# Patient Record
Sex: Female | Born: 1980 | Race: Black or African American | Hispanic: No | Marital: Single | Smoking: Former smoker
Health system: Southern US, Community
[De-identification: ages and names within clinical notes are randomized; demographics above are authoritative.]

## PROBLEM LIST (undated history)

## (undated) DIAGNOSIS — B20 Human immunodeficiency virus [HIV] disease: Secondary | ICD-10-CM

## (undated) DIAGNOSIS — Z21 Asymptomatic human immunodeficiency virus [HIV] infection status: Secondary | ICD-10-CM

## (undated) HISTORY — DX: Human immunodeficiency virus (HIV) disease: B20

## (undated) HISTORY — PX: TUBAL LIGATION: SHX77

## (undated) HISTORY — DX: Asymptomatic human immunodeficiency virus (hiv) infection status: Z21

---
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
## 1999-06-03 ENCOUNTER — Inpatient Hospital Stay (HOSPITAL_COMMUNITY): Admission: AD | Admit: 1999-06-03 | Discharge: 1999-06-06 | Payer: Self-pay | Admitting: Obstetrics & Gynecology

## 1999-07-06 ENCOUNTER — Other Ambulatory Visit: Admission: RE | Admit: 1999-07-06 | Discharge: 1999-07-06 | Payer: Self-pay | Admitting: Obstetrics & Gynecology

## 2000-03-04 ENCOUNTER — Encounter: Admission: RE | Admit: 2000-03-04 | Discharge: 2000-03-04 | Payer: Self-pay | Admitting: Family Medicine

## 2000-04-01 ENCOUNTER — Encounter: Admission: RE | Admit: 2000-04-01 | Discharge: 2000-04-01 | Payer: Self-pay | Admitting: Family Medicine

## 2000-04-08 ENCOUNTER — Encounter: Admission: RE | Admit: 2000-04-08 | Discharge: 2000-04-08 | Payer: Self-pay | Admitting: Family Medicine

## 2000-07-29 ENCOUNTER — Encounter: Admission: RE | Admit: 2000-07-29 | Discharge: 2000-07-29 | Payer: Self-pay | Admitting: Family Medicine

## 2000-09-22 ENCOUNTER — Encounter: Admission: RE | Admit: 2000-09-22 | Discharge: 2000-09-22 | Payer: Self-pay | Admitting: Family Medicine

## 2000-09-29 ENCOUNTER — Encounter: Admission: RE | Admit: 2000-09-29 | Discharge: 2000-09-29 | Payer: Self-pay | Admitting: Family Medicine

## 2000-10-06 ENCOUNTER — Encounter: Admission: RE | Admit: 2000-10-06 | Discharge: 2000-10-06 | Payer: Self-pay | Admitting: Family Medicine

## 2000-10-13 ENCOUNTER — Encounter: Admission: RE | Admit: 2000-10-13 | Discharge: 2000-10-13 | Payer: Self-pay | Admitting: Family Medicine

## 2000-10-14 ENCOUNTER — Emergency Department (HOSPITAL_COMMUNITY): Admission: EM | Admit: 2000-10-14 | Discharge: 2000-10-14 | Payer: Self-pay | Admitting: Emergency Medicine

## 2000-10-14 ENCOUNTER — Encounter: Payer: Self-pay | Admitting: Emergency Medicine

## 2000-11-16 ENCOUNTER — Encounter: Admission: RE | Admit: 2000-11-16 | Discharge: 2000-11-16 | Payer: Self-pay | Admitting: Family Medicine

## 2000-12-15 ENCOUNTER — Encounter: Admission: RE | Admit: 2000-12-15 | Discharge: 2000-12-15 | Payer: Self-pay | Admitting: Family Medicine

## 2001-01-02 ENCOUNTER — Emergency Department (HOSPITAL_COMMUNITY): Admission: EM | Admit: 2001-01-02 | Discharge: 2001-01-03 | Payer: Self-pay | Admitting: Emergency Medicine

## 2001-01-12 ENCOUNTER — Encounter: Admission: RE | Admit: 2001-01-12 | Discharge: 2001-01-12 | Payer: Self-pay | Admitting: Family Medicine

## 2001-01-19 ENCOUNTER — Other Ambulatory Visit: Admission: RE | Admit: 2001-01-19 | Discharge: 2001-01-19 | Payer: Self-pay | Admitting: Family Medicine

## 2001-01-19 ENCOUNTER — Encounter: Admission: RE | Admit: 2001-01-19 | Discharge: 2001-01-19 | Payer: Self-pay | Admitting: Family Medicine

## 2001-04-18 ENCOUNTER — Emergency Department (HOSPITAL_COMMUNITY): Admission: EM | Admit: 2001-04-18 | Discharge: 2001-04-18 | Payer: Self-pay | Admitting: Emergency Medicine

## 2001-06-02 ENCOUNTER — Inpatient Hospital Stay (HOSPITAL_COMMUNITY): Admission: AD | Admit: 2001-06-02 | Discharge: 2001-06-02 | Payer: Self-pay | Admitting: Obstetrics

## 2001-07-11 ENCOUNTER — Encounter: Admission: RE | Admit: 2001-07-11 | Discharge: 2001-07-11 | Payer: Self-pay | Admitting: Family Medicine

## 2001-09-28 ENCOUNTER — Encounter: Admission: RE | Admit: 2001-09-28 | Discharge: 2001-09-28 | Payer: Self-pay | Admitting: Family Medicine

## 2001-10-09 ENCOUNTER — Emergency Department (HOSPITAL_COMMUNITY): Admission: EM | Admit: 2001-10-09 | Discharge: 2001-10-09 | Payer: Self-pay | Admitting: Emergency Medicine

## 2001-10-11 ENCOUNTER — Encounter: Admission: RE | Admit: 2001-10-11 | Discharge: 2001-10-11 | Payer: Self-pay | Admitting: Family Medicine

## 2001-11-07 ENCOUNTER — Emergency Department (HOSPITAL_COMMUNITY): Admission: EM | Admit: 2001-11-07 | Discharge: 2001-11-07 | Payer: Self-pay | Admitting: Emergency Medicine

## 2001-11-09 ENCOUNTER — Emergency Department (HOSPITAL_COMMUNITY): Admission: EM | Admit: 2001-11-09 | Discharge: 2001-11-09 | Payer: Self-pay | Admitting: Emergency Medicine

## 2001-11-20 ENCOUNTER — Encounter: Admission: RE | Admit: 2001-11-20 | Discharge: 2001-11-20 | Payer: Self-pay | Admitting: Family Medicine

## 2001-11-23 ENCOUNTER — Encounter: Admission: RE | Admit: 2001-11-23 | Discharge: 2001-11-23 | Payer: Self-pay | Admitting: Sports Medicine

## 2001-11-27 ENCOUNTER — Encounter: Admission: RE | Admit: 2001-11-27 | Discharge: 2001-11-27 | Payer: Self-pay | Admitting: Family Medicine

## 2001-12-06 ENCOUNTER — Encounter: Admission: RE | Admit: 2001-12-06 | Discharge: 2001-12-06 | Payer: Self-pay | Admitting: Sports Medicine

## 2002-01-04 ENCOUNTER — Encounter: Admission: RE | Admit: 2002-01-04 | Discharge: 2002-01-04 | Payer: Self-pay | Admitting: Family Medicine

## 2002-01-18 ENCOUNTER — Encounter: Admission: RE | Admit: 2002-01-18 | Discharge: 2002-01-18 | Payer: Self-pay | Admitting: Family Medicine

## 2002-01-25 ENCOUNTER — Encounter: Admission: RE | Admit: 2002-01-25 | Discharge: 2002-01-25 | Payer: Self-pay | Admitting: Family Medicine

## 2002-02-05 ENCOUNTER — Encounter: Admission: RE | Admit: 2002-02-05 | Discharge: 2002-02-05 | Payer: Self-pay | Admitting: Family Medicine

## 2002-02-14 ENCOUNTER — Encounter: Admission: RE | Admit: 2002-02-14 | Discharge: 2002-02-14 | Payer: Self-pay | Admitting: Family Medicine

## 2002-02-22 ENCOUNTER — Other Ambulatory Visit: Admission: RE | Admit: 2002-02-22 | Discharge: 2002-02-22 | Payer: Self-pay | Admitting: Family Medicine

## 2002-02-22 ENCOUNTER — Encounter: Admission: RE | Admit: 2002-02-22 | Discharge: 2002-02-22 | Payer: Self-pay | Admitting: Family Medicine

## 2002-03-14 ENCOUNTER — Encounter: Admission: RE | Admit: 2002-03-14 | Discharge: 2002-03-14 | Payer: Self-pay | Admitting: Family Medicine

## 2002-04-03 ENCOUNTER — Encounter: Admission: RE | Admit: 2002-04-03 | Discharge: 2002-04-03 | Payer: Self-pay | Admitting: Family Medicine

## 2002-05-06 ENCOUNTER — Emergency Department (HOSPITAL_COMMUNITY): Admission: EM | Admit: 2002-05-06 | Discharge: 2002-05-06 | Payer: Self-pay | Admitting: Emergency Medicine

## 2002-06-14 ENCOUNTER — Encounter: Admission: RE | Admit: 2002-06-14 | Discharge: 2002-06-14 | Payer: Self-pay | Admitting: Family Medicine

## 2002-06-22 ENCOUNTER — Encounter: Admission: RE | Admit: 2002-06-22 | Discharge: 2002-06-22 | Payer: Self-pay | Admitting: Family Medicine

## 2002-08-28 ENCOUNTER — Encounter: Admission: RE | Admit: 2002-08-28 | Discharge: 2002-08-28 | Payer: Self-pay | Admitting: Family Medicine

## 2002-09-27 ENCOUNTER — Encounter: Payer: Self-pay | Admitting: Sports Medicine

## 2002-09-27 ENCOUNTER — Encounter: Admission: RE | Admit: 2002-09-27 | Discharge: 2002-09-27 | Payer: Self-pay | Admitting: Sports Medicine

## 2002-09-27 ENCOUNTER — Encounter: Admission: RE | Admit: 2002-09-27 | Discharge: 2002-09-27 | Payer: Self-pay | Admitting: Family Medicine

## 2002-10-30 ENCOUNTER — Encounter: Admission: RE | Admit: 2002-10-30 | Discharge: 2002-10-30 | Payer: Self-pay | Admitting: Family Medicine

## 2003-03-28 ENCOUNTER — Encounter: Admission: RE | Admit: 2003-03-28 | Discharge: 2003-03-28 | Payer: Self-pay | Admitting: Family Medicine

## 2003-03-28 ENCOUNTER — Other Ambulatory Visit: Admission: RE | Admit: 2003-03-28 | Discharge: 2003-03-28 | Payer: Self-pay | Admitting: Family Medicine

## 2003-03-28 ENCOUNTER — Encounter: Payer: Self-pay | Admitting: Family Medicine

## 2003-05-21 ENCOUNTER — Encounter: Admission: RE | Admit: 2003-05-21 | Discharge: 2003-05-21 | Payer: Self-pay | Admitting: Family Medicine

## 2003-08-01 ENCOUNTER — Encounter: Admission: RE | Admit: 2003-08-01 | Discharge: 2003-08-01 | Payer: Self-pay | Admitting: Family Medicine

## 2003-08-09 ENCOUNTER — Encounter: Admission: RE | Admit: 2003-08-09 | Discharge: 2003-08-09 | Payer: Self-pay | Admitting: Family Medicine

## 2003-09-26 ENCOUNTER — Encounter: Admission: RE | Admit: 2003-09-26 | Discharge: 2003-09-26 | Payer: Self-pay | Admitting: Family Medicine

## 2003-11-28 ENCOUNTER — Encounter: Admission: RE | Admit: 2003-11-28 | Discharge: 2003-11-28 | Payer: Self-pay | Admitting: Family Medicine

## 2004-02-13 ENCOUNTER — Ambulatory Visit: Payer: Self-pay | Admitting: Family Medicine

## 2004-02-20 ENCOUNTER — Ambulatory Visit: Payer: Self-pay | Admitting: Family Medicine

## 2004-02-27 ENCOUNTER — Ambulatory Visit: Payer: Self-pay | Admitting: Family Medicine

## 2004-03-19 ENCOUNTER — Ambulatory Visit: Payer: Self-pay | Admitting: Family Medicine

## 2004-05-07 ENCOUNTER — Ambulatory Visit: Payer: Self-pay | Admitting: Family Medicine

## 2004-07-08 ENCOUNTER — Ambulatory Visit: Payer: Self-pay | Admitting: Family Medicine

## 2004-07-13 ENCOUNTER — Ambulatory Visit: Payer: Self-pay | Admitting: Family Medicine

## 2004-07-14 ENCOUNTER — Ambulatory Visit: Payer: Self-pay | Admitting: Family Medicine

## 2004-07-23 ENCOUNTER — Ambulatory Visit: Payer: Self-pay | Admitting: Family Medicine

## 2004-07-23 ENCOUNTER — Other Ambulatory Visit: Admission: RE | Admit: 2004-07-23 | Discharge: 2004-07-23 | Payer: Self-pay | Admitting: Family Medicine

## 2004-07-23 ENCOUNTER — Encounter: Payer: Self-pay | Admitting: Family Medicine

## 2004-09-03 ENCOUNTER — Ambulatory Visit: Payer: Self-pay | Admitting: Sports Medicine

## 2004-09-16 ENCOUNTER — Ambulatory Visit: Payer: Self-pay | Admitting: Family Medicine

## 2004-11-06 ENCOUNTER — Ambulatory Visit: Payer: Self-pay | Admitting: Family Medicine

## 2004-11-13 ENCOUNTER — Ambulatory Visit: Payer: Self-pay | Admitting: Family Medicine

## 2004-11-26 ENCOUNTER — Ambulatory Visit: Payer: Self-pay | Admitting: Family Medicine

## 2004-12-22 ENCOUNTER — Emergency Department (HOSPITAL_COMMUNITY): Admission: EM | Admit: 2004-12-22 | Discharge: 2004-12-22 | Payer: Self-pay | Admitting: Emergency Medicine

## 2005-05-21 ENCOUNTER — Ambulatory Visit: Payer: Self-pay | Admitting: Sports Medicine

## 2005-08-23 ENCOUNTER — Ambulatory Visit: Payer: Self-pay | Admitting: Family Medicine

## 2005-09-04 ENCOUNTER — Encounter (INDEPENDENT_AMBULATORY_CARE_PROVIDER_SITE_OTHER): Payer: Self-pay | Admitting: *Deleted

## 2005-09-14 ENCOUNTER — Ambulatory Visit: Payer: Self-pay | Admitting: Family Medicine

## 2005-09-14 ENCOUNTER — Encounter: Payer: Self-pay | Admitting: Family Medicine

## 2005-10-07 ENCOUNTER — Ambulatory Visit: Payer: Self-pay | Admitting: Family Medicine

## 2005-12-23 ENCOUNTER — Ambulatory Visit: Payer: Self-pay | Admitting: Family Medicine

## 2006-05-26 ENCOUNTER — Ambulatory Visit: Payer: Self-pay | Admitting: Family Medicine

## 2006-06-02 ENCOUNTER — Ambulatory Visit: Payer: Self-pay | Admitting: Family Medicine

## 2006-07-29 ENCOUNTER — Encounter (INDEPENDENT_AMBULATORY_CARE_PROVIDER_SITE_OTHER): Payer: Self-pay | Admitting: *Deleted

## 2006-12-13 ENCOUNTER — Ambulatory Visit: Payer: Self-pay | Admitting: Family Medicine

## 2006-12-13 DIAGNOSIS — F329 Major depressive disorder, single episode, unspecified: Secondary | ICD-10-CM

## 2006-12-20 ENCOUNTER — Encounter: Payer: Self-pay | Admitting: Family Medicine

## 2006-12-20 ENCOUNTER — Other Ambulatory Visit: Admission: RE | Admit: 2006-12-20 | Discharge: 2006-12-20 | Payer: Self-pay | Admitting: Family Medicine

## 2006-12-20 ENCOUNTER — Ambulatory Visit: Payer: Self-pay | Admitting: Family Medicine

## 2006-12-20 LAB — CONVERTED CEMR LAB
HIV-1 antibody: POSITIVE — AB
HIV-2 Ab: UNDETERMINED — AB
HIV: REACTIVE
KOH Prep: NEGATIVE

## 2006-12-22 ENCOUNTER — Encounter (INDEPENDENT_AMBULATORY_CARE_PROVIDER_SITE_OTHER): Payer: Self-pay | Admitting: *Deleted

## 2006-12-27 ENCOUNTER — Ambulatory Visit: Payer: Self-pay | Admitting: Family Medicine

## 2006-12-27 DIAGNOSIS — B20 Human immunodeficiency virus [HIV] disease: Secondary | ICD-10-CM

## 2006-12-27 DIAGNOSIS — A539 Syphilis, unspecified: Secondary | ICD-10-CM

## 2006-12-28 ENCOUNTER — Encounter: Payer: Self-pay | Admitting: Family Medicine

## 2006-12-28 ENCOUNTER — Telehealth: Payer: Self-pay | Admitting: Family Medicine

## 2007-01-19 ENCOUNTER — Ambulatory Visit: Payer: Self-pay | Admitting: Internal Medicine

## 2007-01-19 ENCOUNTER — Encounter: Admission: RE | Admit: 2007-01-19 | Discharge: 2007-01-19 | Payer: Self-pay | Admitting: Internal Medicine

## 2007-01-19 LAB — CONVERTED CEMR LAB
Bilirubin Urine: NEGATIVE
HCV Ab: NEGATIVE
HIV 1 RNA Quant: 2760 copies/mL — ABNORMAL HIGH (ref <50)
Hep B Core Total Ab: NEGATIVE
Hep B S Ab: POSITIVE — AB
Hepatitis B Surface Ag: NEGATIVE
Ketones, ur: 15 mg/dL — AB
Protein, ur: NEGATIVE mg/dL
Specific Gravity, Urine: 1.028 (ref 1.005–1.03)

## 2007-02-07 ENCOUNTER — Ambulatory Visit: Payer: Self-pay | Admitting: Internal Medicine

## 2007-05-29 ENCOUNTER — Encounter: Payer: Self-pay | Admitting: Internal Medicine

## 2007-06-21 ENCOUNTER — Ambulatory Visit: Payer: Self-pay | Admitting: Internal Medicine

## 2007-06-21 ENCOUNTER — Encounter: Admission: RE | Admit: 2007-06-21 | Discharge: 2007-06-21 | Payer: Self-pay | Admitting: Internal Medicine

## 2007-06-21 LAB — CONVERTED CEMR LAB
ALT: 44 units/L — ABNORMAL HIGH (ref 0–35)
Alkaline Phosphatase: 25 units/L — ABNORMAL LOW (ref 39–117)
BUN: 13 mg/dL (ref 6–23)
Basophils Relative: 1 % (ref 0–1)
CO2: 23 meq/L (ref 19–32)
Calcium: 9.4 mg/dL (ref 8.4–10.5)
Chloride: 103 meq/L (ref 96–112)
Eosinophils Absolute: 0.1 10*3/uL (ref 0.0–0.7)
Eosinophils Relative: 5 % (ref 0–5)
HCT: 37.8 % (ref 36.0–46.0)
HIV 1 RNA Quant: 3300 copies/mL — ABNORMAL HIGH (ref <50)
HIV-1 RNA Quant, Log: 3.52 — ABNORMAL HIGH (ref <1.70)
MCV: 92.2 fL (ref 78.0–100.0)
Neutrophils Relative %: 28 % — ABNORMAL LOW (ref 43–77)
Potassium: 4.1 meq/L (ref 3.5–5.3)
Total Bilirubin: 0.8 mg/dL (ref 0.3–1.2)
WBC: 3.1 10*3/uL — ABNORMAL LOW (ref 4.0–10.5)

## 2007-07-14 ENCOUNTER — Ambulatory Visit: Payer: Self-pay | Admitting: Internal Medicine

## 2007-07-14 ENCOUNTER — Encounter (INDEPENDENT_AMBULATORY_CARE_PROVIDER_SITE_OTHER): Payer: Self-pay | Admitting: *Deleted

## 2007-10-16 ENCOUNTER — Ambulatory Visit: Payer: Self-pay | Admitting: Internal Medicine

## 2007-10-16 ENCOUNTER — Encounter: Admission: RE | Admit: 2007-10-16 | Discharge: 2007-10-16 | Payer: Self-pay | Admitting: Internal Medicine

## 2007-10-16 LAB — CONVERTED CEMR LAB
ALT: 30 units/L (ref 0–35)
Alkaline Phosphatase: 25 units/L — ABNORMAL LOW (ref 39–117)
BUN: 14 mg/dL (ref 6–23)
Eosinophils Absolute: 0.2 10*3/uL (ref 0.0–0.7)
HIV 1 RNA Quant: 6630 copies/mL — ABNORMAL HIGH (ref <50)
Hemoglobin: 11.5 g/dL — ABNORMAL LOW (ref 12.0–15.0)
Lymphocytes Relative: 52 % — ABNORMAL HIGH (ref 12–46)
MCHC: 31.6 g/dL (ref 30.0–36.0)
MCV: 93.1 fL (ref 78.0–100.0)
Neutro Abs: 1 10*3/uL — ABNORMAL LOW (ref 1.7–7.7)
Neutrophils Relative %: 34 % — ABNORMAL LOW (ref 43–77)
Platelets: 267 10*3/uL (ref 150–400)
Potassium: 4.2 meq/L (ref 3.5–5.3)
RBC: 3.91 M/uL (ref 3.87–5.11)
Sodium: 139 meq/L (ref 135–145)
Total Bilirubin: 0.3 mg/dL (ref 0.3–1.2)
Total Protein: 7.2 g/dL (ref 6.0–8.3)
WBC: 2.9 10*3/uL — ABNORMAL LOW (ref 4.0–10.5)

## 2007-10-18 ENCOUNTER — Ambulatory Visit: Payer: Self-pay | Admitting: Internal Medicine

## 2007-10-18 ENCOUNTER — Encounter: Payer: Self-pay | Admitting: Internal Medicine

## 2007-10-18 DIAGNOSIS — J309 Allergic rhinitis, unspecified: Secondary | ICD-10-CM | POA: Insufficient documentation

## 2007-10-25 ENCOUNTER — Telehealth: Payer: Self-pay

## 2007-11-08 ENCOUNTER — Ambulatory Visit: Payer: Self-pay | Admitting: Family Medicine

## 2007-11-08 ENCOUNTER — Encounter: Payer: Self-pay | Admitting: Family Medicine

## 2007-11-08 ENCOUNTER — Encounter: Payer: Self-pay | Admitting: Internal Medicine

## 2007-11-08 ENCOUNTER — Telehealth: Payer: Self-pay | Admitting: Internal Medicine

## 2007-11-08 ENCOUNTER — Telehealth: Payer: Self-pay | Admitting: *Deleted

## 2007-11-08 LAB — CONVERTED CEMR LAB: H Pylori IgG: POSITIVE

## 2007-11-10 LAB — CONVERTED CEMR LAB
ALT: 53 U/L — ABNORMAL HIGH
AST: 35 U/L
Albumin: 4.3 g/dL
Alkaline Phosphatase: 26 U/L — ABNORMAL LOW
BUN: 12 mg/dL
Basophils Absolute: 0 K/uL
Basophils Relative: 1 %
CO2: 23 meq/L
Calcium: 9.3 mg/dL
Chloride: 104 meq/L
Creatinine, Ser: 0.77 mg/dL
Eosinophils Absolute: 0.1 K/uL
Eosinophils Relative: 4 %
Glucose, Bld: 72 mg/dL
HCT: 37.2 %
Hemoglobin: 12.1 g/dL
Lipase: 23 U/L
Lymphocytes Relative: 42 %
Lymphs Abs: 1.5 K/uL
MCHC: 32.5 g/dL
MCV: 91.6 fL
Monocytes Absolute: 0.3 K/uL
Monocytes Relative: 8 %
Neutro Abs: 1.6 K/uL — ABNORMAL LOW
Neutrophils Relative %: 45 %
Platelets: 261 K/uL
Potassium: 4.1 meq/L
RBC: 4.06 M/uL
RDW: 12.9 %
Sodium: 139 meq/L
Total Bilirubin: 0.4 mg/dL
Total Protein: 7.8 g/dL
WBC: 3.5 10*3/microliter — ABNORMAL LOW

## 2007-11-14 ENCOUNTER — Telehealth: Payer: Self-pay | Admitting: Internal Medicine

## 2007-11-14 ENCOUNTER — Encounter: Payer: Self-pay | Admitting: Internal Medicine

## 2007-11-14 ENCOUNTER — Encounter: Payer: Self-pay | Admitting: *Deleted

## 2007-11-20 ENCOUNTER — Telehealth: Payer: Self-pay | Admitting: *Deleted

## 2007-11-20 ENCOUNTER — Ambulatory Visit: Payer: Self-pay | Admitting: Family Medicine

## 2007-11-21 ENCOUNTER — Encounter: Admission: RE | Admit: 2007-11-21 | Discharge: 2007-11-21 | Payer: Self-pay | Admitting: Family Medicine

## 2008-01-30 ENCOUNTER — Ambulatory Visit: Payer: Self-pay | Admitting: Internal Medicine

## 2008-01-30 LAB — CONVERTED CEMR LAB
Alkaline Phosphatase: 25 units/L — ABNORMAL LOW (ref 39–117)
Basophils Relative: 0 % (ref 0–1)
Calcium: 8.9 mg/dL (ref 8.4–10.5)
Chloride: 106 meq/L (ref 96–112)
Creatinine, Ser: 0.76 mg/dL (ref 0.40–1.20)
Eosinophils Absolute: 0.1 10*3/uL (ref 0.0–0.7)
HCT: 36.4 % (ref 36.0–46.0)
Lymphocytes Relative: 51 % — ABNORMAL HIGH (ref 12–46)
MCV: 92.2 fL (ref 78.0–100.0)
Monocytes Absolute: 0.2 10*3/uL (ref 0.1–1.0)
Monocytes Relative: 7 % (ref 3–12)
Neutro Abs: 1 10*3/uL — ABNORMAL LOW (ref 1.7–7.7)
Neutrophils Relative %: 37 % — ABNORMAL LOW (ref 43–77)
Platelets: 263 10*3/uL (ref 150–400)
RBC: 3.95 M/uL (ref 3.87–5.11)
RDW: 12.6 % (ref 11.5–15.5)
Total Bilirubin: 0.4 mg/dL (ref 0.3–1.2)
Total Protein: 7.6 g/dL (ref 6.0–8.3)

## 2008-02-16 ENCOUNTER — Ambulatory Visit: Payer: Self-pay | Admitting: Internal Medicine

## 2008-03-06 ENCOUNTER — Telehealth: Payer: Self-pay | Admitting: Internal Medicine

## 2008-03-07 ENCOUNTER — Telehealth (INDEPENDENT_AMBULATORY_CARE_PROVIDER_SITE_OTHER): Payer: Self-pay | Admitting: *Deleted

## 2008-03-11 ENCOUNTER — Telehealth: Payer: Self-pay | Admitting: Family Medicine

## 2008-03-12 ENCOUNTER — Encounter: Payer: Self-pay | Admitting: Internal Medicine

## 2008-03-21 ENCOUNTER — Ambulatory Visit: Payer: Self-pay | Admitting: Family Medicine

## 2008-03-25 ENCOUNTER — Encounter: Payer: Self-pay | Admitting: Internal Medicine

## 2008-04-23 ENCOUNTER — Telehealth: Payer: Self-pay | Admitting: *Deleted

## 2008-06-04 ENCOUNTER — Telehealth: Payer: Self-pay | Admitting: Family Medicine

## 2008-07-04 ENCOUNTER — Ambulatory Visit: Payer: Self-pay | Admitting: Family Medicine

## 2008-07-10 ENCOUNTER — Telehealth: Payer: Self-pay | Admitting: *Deleted

## 2008-07-19 ENCOUNTER — Ambulatory Visit: Payer: Self-pay | Admitting: Family Medicine

## 2008-07-19 ENCOUNTER — Telehealth: Payer: Self-pay | Admitting: Family Medicine

## 2008-08-01 ENCOUNTER — Telehealth: Payer: Self-pay | Admitting: *Deleted

## 2008-08-06 ENCOUNTER — Telehealth (INDEPENDENT_AMBULATORY_CARE_PROVIDER_SITE_OTHER): Payer: Self-pay | Admitting: *Deleted

## 2008-08-06 ENCOUNTER — Ambulatory Visit: Payer: Self-pay | Admitting: Family Medicine

## 2008-10-09 ENCOUNTER — Encounter: Payer: Self-pay | Admitting: *Deleted

## 2008-10-18 ENCOUNTER — Encounter: Payer: Self-pay | Admitting: Internal Medicine

## 2008-10-18 ENCOUNTER — Ambulatory Visit: Payer: Self-pay | Admitting: Internal Medicine

## 2008-10-25 ENCOUNTER — Ambulatory Visit: Payer: Self-pay | Admitting: Internal Medicine

## 2008-10-25 ENCOUNTER — Telehealth: Payer: Self-pay | Admitting: Family Medicine

## 2008-10-25 LAB — CONVERTED CEMR LAB
ALT: 23 units/L (ref 0–35)
AST: 25 units/L (ref 0–37)
Albumin: 4.1 g/dL (ref 3.5–5.2)
Alkaline Phosphatase: 30 units/L — ABNORMAL LOW (ref 39–117)
Basophils Absolute: 0 10*3/uL (ref 0.0–0.1)
CO2: 24 meq/L (ref 19–32)
Chlamydia, Swab/Urine, PCR: NEGATIVE
Chloride: 107 meq/L (ref 96–112)
GC Probe Amp, Urine: NEGATIVE
GFR calc Af Amer: >60 mL/min (ref >60)
GFR calc non Af Amer: >60 mL/min (ref >60)
HCT: 35.2 % — ABNORMAL LOW (ref 36.0–46.0)
HDL: 32 mg/dL — ABNORMAL LOW (ref >39)
HIV 1 RNA Quant: 8680 copies/mL — ABNORMAL HIGH (ref <48)
Hemoglobin: 11.7 g/dL — ABNORMAL LOW (ref 12.0–15.0)
LDL Cholesterol: 91 mg/dL (ref 0–99)
Lymphocytes Relative: 35 % (ref 12–46)
MCV: 90 fL (ref 78.0–100.0)
Monocytes Absolute: 0.3 10*3/uL (ref 0.1–1.0)
Neutro Abs: 1.5 10*3/uL — ABNORMAL LOW (ref 1.7–7.7)
Neutrophils Relative %: 46 % (ref 43–77)
Platelets: 252 10*3/uL (ref 150–400)
RDW: 13 % (ref 11.5–15.5)
Sodium: 143 meq/L (ref 135–145)
Total CHOL/HDL Ratio: 4.7
Total Protein: 7.5 g/dL (ref 6.0–8.3)
Triglycerides: 136 mg/dL (ref <150)

## 2008-11-08 ENCOUNTER — Encounter: Payer: Self-pay | Admitting: Internal Medicine

## 2008-11-11 ENCOUNTER — Encounter: Payer: Self-pay | Admitting: Internal Medicine

## 2008-11-20 ENCOUNTER — Ambulatory Visit: Payer: Self-pay | Admitting: Internal Medicine

## 2009-05-06 ENCOUNTER — Telehealth: Payer: Self-pay | Admitting: Family Medicine

## 2009-07-02 ENCOUNTER — Inpatient Hospital Stay (HOSPITAL_COMMUNITY): Admission: AD | Admit: 2009-07-02 | Discharge: 2009-07-02 | Payer: Self-pay | Admitting: Obstetrics and Gynecology

## 2009-07-09 ENCOUNTER — Encounter: Payer: Self-pay | Admitting: Family

## 2009-07-09 ENCOUNTER — Ambulatory Visit: Payer: Self-pay | Admitting: Obstetrics & Gynecology

## 2009-07-09 LAB — CONVERTED CEMR LAB
Basophils Absolute: 0 10*3/uL (ref 0.0–0.1)
Basophils Relative: 0 % (ref 0–1)
Eosinophils Absolute: 0.1 10*3/uL (ref 0.0–0.7)
Eosinophils Relative: 4 % (ref 0–5)
Lymphs Abs: 0.7 10*3/uL (ref 0.7–4.0)
Monocytes Absolute: 0.2 10*3/uL (ref 0.1–1.0)
Monocytes Relative: 6 % (ref 3–12)
Neutrophils Relative %: 62 % (ref 43–77)
RBC: 3.47 M/uL — ABNORMAL LOW (ref 3.87–5.11)
Rh Type: POSITIVE
Rubella: 34 intl units/mL — ABNORMAL HIGH

## 2009-07-11 ENCOUNTER — Ambulatory Visit: Payer: Self-pay | Admitting: Internal Medicine

## 2009-07-11 LAB — CONVERTED CEMR LAB
ALT: 9 units/L (ref 0–35)
AST: 15 units/L (ref 0–37)
Alkaline Phosphatase: 23 units/L — ABNORMAL LOW (ref 39–117)
Calcium: 8.7 mg/dL (ref 8.4–10.5)
Chloride: 106 meq/L (ref 96–112)
Eosinophils Absolute: 0.2 10*3/uL (ref 0.0–0.7)
HCT: 31.3 % — ABNORMAL LOW (ref 36.0–46.0)
HIV 1 RNA Quant: 3680 copies/mL — ABNORMAL HIGH (ref <48)
Hemoglobin: 10.3 g/dL — ABNORMAL LOW (ref 12.0–15.0)
Monocytes Absolute: 0.2 10*3/uL (ref 0.1–1.0)
Neutrophils Relative %: 47 % (ref 43–77)
Potassium: 4.2 meq/L (ref 3.5–5.3)
Sodium: 136 meq/L (ref 135–145)
Total Bilirubin: 0.2 mg/dL — ABNORMAL LOW (ref 0.3–1.2)
Total Protein: 6.7 g/dL (ref 6.0–8.3)

## 2009-07-28 ENCOUNTER — Ambulatory Visit: Payer: Self-pay | Admitting: Internal Medicine

## 2009-07-30 ENCOUNTER — Encounter: Payer: Self-pay | Admitting: Family

## 2009-07-30 ENCOUNTER — Ambulatory Visit: Payer: Self-pay | Admitting: Obstetrics and Gynecology

## 2009-07-31 ENCOUNTER — Ambulatory Visit (HOSPITAL_COMMUNITY): Admission: RE | Admit: 2009-07-31 | Discharge: 2009-07-31 | Payer: Self-pay | Admitting: Obstetrics & Gynecology

## 2009-08-11 ENCOUNTER — Telehealth: Payer: Self-pay | Admitting: Internal Medicine

## 2009-08-14 ENCOUNTER — Encounter (INDEPENDENT_AMBULATORY_CARE_PROVIDER_SITE_OTHER): Payer: Self-pay | Admitting: *Deleted

## 2009-08-14 ENCOUNTER — Ambulatory Visit (HOSPITAL_COMMUNITY): Admission: RE | Admit: 2009-08-14 | Discharge: 2009-08-14 | Payer: Self-pay | Admitting: Obstetrics & Gynecology

## 2009-08-27 ENCOUNTER — Inpatient Hospital Stay (HOSPITAL_COMMUNITY): Admission: AD | Admit: 2009-08-27 | Discharge: 2009-08-27 | Payer: Self-pay | Admitting: Obstetrics & Gynecology

## 2009-09-01 ENCOUNTER — Ambulatory Visit (HOSPITAL_COMMUNITY): Admission: RE | Admit: 2009-09-01 | Discharge: 2009-09-01 | Payer: Self-pay | Admitting: Obstetrics and Gynecology

## 2009-09-03 ENCOUNTER — Ambulatory Visit: Payer: Self-pay | Admitting: Obstetrics and Gynecology

## 2009-10-01 ENCOUNTER — Ambulatory Visit: Payer: Self-pay | Admitting: Obstetrics and Gynecology

## 2009-10-06 ENCOUNTER — Encounter (INDEPENDENT_AMBULATORY_CARE_PROVIDER_SITE_OTHER): Payer: Self-pay | Admitting: *Deleted

## 2009-10-28 ENCOUNTER — Ambulatory Visit: Payer: Self-pay | Admitting: Internal Medicine

## 2009-10-28 LAB — CONVERTED CEMR LAB
AST: 12 units/L (ref 0–37)
Alkaline Phosphatase: 36 units/L — ABNORMAL LOW (ref 39–117)
Basophils Absolute: 0 10*3/uL (ref 0.0–0.1)
Calcium: 8.5 mg/dL (ref 8.4–10.5)
Eosinophils Absolute: 0.2 10*3/uL (ref 0.0–0.7)
Eosinophils Relative: 4 % (ref 0–5)
Glucose, Bld: 124 mg/dL — ABNORMAL HIGH (ref 70–99)
HCT: 30.3 % — ABNORMAL LOW (ref 36.0–46.0)
Hemoglobin: 10.2 g/dL — ABNORMAL LOW (ref 12.0–15.0)
Neutro Abs: 2.2 10*3/uL (ref 1.7–7.7)
Neutrophils Relative %: 54 % (ref 43–77)
Platelets: 258 10*3/uL (ref 150–400)
Potassium: 3.9 meq/L (ref 3.5–5.3)
RBC: 2.95 M/uL — ABNORMAL LOW (ref 3.87–5.11)
Sodium: 134 meq/L — ABNORMAL LOW (ref 135–145)
WBC: 4.1 10*3/uL (ref 4.0–10.5)

## 2009-10-29 ENCOUNTER — Ambulatory Visit: Payer: Self-pay | Admitting: Obstetrics and Gynecology

## 2009-11-05 ENCOUNTER — Telehealth: Payer: Self-pay | Admitting: Internal Medicine

## 2009-11-13 ENCOUNTER — Ambulatory Visit: Payer: Self-pay | Admitting: Internal Medicine

## 2009-11-14 ENCOUNTER — Encounter: Payer: Self-pay | Admitting: Internal Medicine

## 2009-11-14 LAB — CONVERTED CEMR LAB: Chlamydia, Swab/Urine, PCR: NEGATIVE

## 2009-11-18 ENCOUNTER — Inpatient Hospital Stay (HOSPITAL_COMMUNITY): Admission: AD | Admit: 2009-11-18 | Discharge: 2009-11-18 | Payer: Self-pay | Admitting: Obstetrics & Gynecology

## 2009-11-25 ENCOUNTER — Telehealth (INDEPENDENT_AMBULATORY_CARE_PROVIDER_SITE_OTHER): Payer: Self-pay | Admitting: Licensed Clinical Social Worker

## 2009-12-16 ENCOUNTER — Encounter: Payer: Self-pay | Admitting: Internal Medicine

## 2010-01-24 ENCOUNTER — Inpatient Hospital Stay (HOSPITAL_COMMUNITY): Admission: AD | Admit: 2010-01-24 | Discharge: 2010-01-28 | Payer: Self-pay | Admitting: Obstetrics & Gynecology

## 2010-01-26 ENCOUNTER — Telehealth: Payer: Self-pay | Admitting: Internal Medicine

## 2010-01-29 ENCOUNTER — Telehealth (INDEPENDENT_AMBULATORY_CARE_PROVIDER_SITE_OTHER): Payer: Self-pay | Admitting: *Deleted

## 2010-02-10 ENCOUNTER — Ambulatory Visit: Payer: Self-pay | Admitting: Internal Medicine

## 2010-02-20 ENCOUNTER — Telehealth (INDEPENDENT_AMBULATORY_CARE_PROVIDER_SITE_OTHER): Payer: Self-pay | Admitting: *Deleted

## 2010-04-06 ENCOUNTER — Ambulatory Visit: Payer: Self-pay | Admitting: Family Medicine

## 2010-04-06 ENCOUNTER — Ambulatory Visit: Payer: Self-pay | Admitting: Internal Medicine

## 2010-04-06 LAB — CONVERTED CEMR LAB
Albumin: 4.5 g/dL (ref 3.5–5.2)
Beta hcg, urine, semiquantitative: NEGATIVE
Calcium: 8.8 mg/dL (ref 8.4–10.5)
Chloride: 103 meq/L (ref 96–112)
Creatinine, Ser: 0.87 mg/dL (ref 0.40–1.20)
Glucose, Bld: 84 mg/dL (ref 70–99)
HCT: 33.5 % — ABNORMAL LOW (ref 36.0–46.0)
HIV 1 RNA Quant: 139 copies/mL — ABNORMAL HIGH (ref <20)
HIV-1 RNA Quant, Log: 2.14 — ABNORMAL HIGH (ref <1.30)
Lymphocytes Relative: 63 % — ABNORMAL HIGH (ref 12–46)
Lymphs Abs: 2.3 10*3/uL (ref 0.7–4.0)
MCHC: 31 g/dL (ref 30.0–36.0)
Neutro Abs: 1 10*3/uL — ABNORMAL LOW (ref 1.7–7.7)
Neutrophils Relative %: 28 % — ABNORMAL LOW (ref 43–77)
RBC: 3.42 M/uL — ABNORMAL LOW (ref 3.87–5.11)
RDW: 15 % (ref 11.5–15.5)
Total Protein: 7 g/dL (ref 6.0–8.3)
WBC: 3.6 10*3/uL — ABNORMAL LOW (ref 4.0–10.5)

## 2010-04-07 ENCOUNTER — Encounter: Payer: Self-pay | Admitting: Family Medicine

## 2010-06-11 ENCOUNTER — Encounter (INDEPENDENT_AMBULATORY_CARE_PROVIDER_SITE_OTHER): Payer: Self-pay | Admitting: *Deleted

## 2010-06-21 ENCOUNTER — Encounter: Payer: Self-pay | Admitting: Obstetrics & Gynecology

## 2010-06-30 ENCOUNTER — Ambulatory Visit: Admit: 2010-06-30 | Payer: Self-pay

## 2010-06-30 NOTE — Assessment & Plan Note (Signed)
Summary: NEED HER MEDICATION/ SB.   Primary Provider:  Pearlean Brownie MD  CC:  pt to be seen to start on medication, c/o right big toe pain, and Depression.  History of Present Illness: Pt here to get started on HIV meds. She c/o some nausea. She als has right big toe pain.  No history of trauma. Sh does not know if her Medicaid has been approved yet.  Depression History:      The patient denies a depressed mood most of the day and a diminished interest in her usual daily activities.        Suicide risk questions reveal that she does not feel like life is worth living.  The patient denies that she wishes that she were dead and denies that she has thought about ending her life.        Preventive Screening-Counseling & Management  Alcohol-Tobacco     Alcohol drinks/day: 0     Smoking Status: never  Caffeine-Diet-Exercise     Caffeine use/day: 0     Does Patient Exercise: yes     Type of exercise: gym     Times/week: 3  Safety-Violence-Falls     Seat Belt Use: 100      Sexual History:  n/a.        Drug Use:  never.     Updated Prior Medication List: KALETRA 200-50 MG TABS (LOPINAVIR-RITONAVIR) Take 2  tablets by mouth two times a day COMBIVIR 150-300 MG TABS (LAMIVUDINE-ZIDOVUDINE) Take 1 tablet by mouth two times a day  Current Allergies (reviewed today): ! SULFA CODEINE PHOSPHATE (CODEINE PHOSPHATE) KEFLEX (CEPHALEXIN) Past History:  Past Medical History: Last updated: 07/04/2008 Depression Pregnancy with termination January 2010  Social History: Sexual History:  n/a  Review of Systems  The patient denies anorexia, fever, and weight loss.    Vital Signs:  Patient profile:   30 year old female Menstrual status:  regular Height:      65 inches (165.10 cm) Weight:      155.2 pounds (70.55 kg) BMI:     25.92 Temp:     97.6 degrees F (36.44 degrees C) oral Pulse rate:   90 / minute BP sitting:   110 / 68  (left arm)  Vitals Entered By: Wendall Mola CMA ( AAMA) (July 28, 2009 10:00 AM) CC: pt to be seen to start on medication, c/o right big toe pain, Depression Is Patient Diabetic? No Pain Assessment Patient in pain? yes     Location: right toe Intensity: 10 Type: sharp Onset of pain  Sudden Nutritional Status BMI of 25 - 29 = overweight Nutritional Status Detail appetite "not too good"  Does patient need assistance? Functional Status Self care Ambulation Normal   Physical Exam  General:  alert, well-developed, well-nourished, and well-hydrated.   Head:  normocephalic and atraumatic.   Mouth:  pharynx pink and moist.   Lungs:  normal breath sounds.          Medication Adherence: 07/28/2009   Adherence to medications reviewed with patient. Counseling to provide adequate adherence provided                                Impression & Recommendations:  Problem # 1:  HIV DISEASE (ICD-042) Pt's genotype shows niave pattern. Will start her on Combivir and Kaletra.  Potential side effects were discussed.  She will f/u in 3 months for repeat labs. Pt  to f/u with [facility] Diagnostics Reviewed:  HIV: HIV positive - not AIDS (11/20/2008)   HIV-Western blot: Positive (12/20/2006)   CD4: 280 (07/11/2009)   WBC: 2.7 (07/11/2009)   Hgb: 10.3 (07/11/2009)   HCT: 31.3 (07/11/2009)   Platelets: 251 (07/11/2009) HIV genotype: * (07/11/2009)   HIV-1 RNA: 3680 (07/11/2009)   HBSAg: NEG (07/09/2009)  Medications Added to Medication List This Visit: 1)  Kaletra 200-50 Mg Tabs (Lopinavir-ritonavir) .... Take 2  tablets by mouth two times a day 2)  Combivir 150-300 Mg Tabs (Lamivudine-zidovudine) .... Take 1 tablet by mouth two times a day  Other Orders: Est. Patient Level III (81191) Future Orders: T-CD4SP ([facility]) (CD4SP) ... 10/26/2009 T-HIV Viral Load [phone]) ... 10/26/2009 T-Comprehensive Metabolic Panel [phone]) ... 10/26/2009 T-CBC w/Diff ([id number]) ... 10/26/2009  Patient  Instructions: 1)  Please schedule a follow-up appointment in 3 months, 2 weeks after labs.  Prescriptions: COMBIVIR 150-300 MG TABS (LAMIVUDINE-ZIDOVUDINE) Take 1 tablet by mouth two times a day  #60 x 5   Entered and Authorized by:   Yisroel Ramming MD   Signed by:   Yisroel Ramming MD on 07/28/2009   Method used:   Print then Give to Patient   RxID:   [id number] KALETRA 200-50 MG TABS (LOPINAVIR-RITONAVIR) Take 2  tablets by mouth two times a day  #120 x 5   Entered and Authorized by:   Yisroel Ramming MD   Signed by:   Yisroel Ramming MD on 07/28/2009   Method used:   Print then Give to Patient   RxID:   [id number]  Process Orders Check Orders Results:     [facility]: ABN not required for this insurance Tests Sent for requisitioning (July 28, 2009 10:22 AM):     10/26/2009: [facility] -- T-HIV Viral Load [phone] (signed)     10/26/2009: [facility] -- T-Comprehensive Metabolic Panel [[id number]] (signed)     10/26/2009: [facility] -- [facility] w/Diff [[id number]] (signed)

## 2010-06-30 NOTE — Progress Notes (Signed)
Summary: Constipation   Phone Note Call from Patient   Summary of Call: Pt states she has been constipated for 2  weeks. She thinks it is related  to her HIV meds. . She also started the prenatal vitamins at the same time. I explained to patient it may be the prenatal vitamins and not the HIV meds.  She also thinks she may have a hemorrhoid . I advised her to have the primary care physician take a look.   Pt was advised to drink water and green leafy vegetables, increase fiber intake to see if constipation is relieved.   Call the office if symptoms continue or worsen.  Tomasita Morrow RN  August 11, 2009 1:52 PM

## 2010-06-30 NOTE — Progress Notes (Signed)
Summary: Referral to OB/GYN  Phone Note Outgoing Call   Call placed by: Annice Pih Call placed to: Patient Action Taken: Phone Call Completed Details for Reason: called pt. to reschedule Summary of Call: Called pt. to reschedule appt., she is in [place] and will call to make appt. when she gets back next week.  Pt. is not happy with [facility], said she will not go back there,  got her records.  She felt like the midwife was not familiar with HIV because they told her she would have to have a C-section at 38 weeks without knowing what her labs were. Wants to be referred somewhere else. Initial call taken by: Wendall Mola CMA Duncan Dull),  November 05, 2009 5:01 PM  Follow-up for Phone Call        was she seen in [facility]? Follow-up by: Yisroel Ramming MD,  November 06, 2009 10:53 AM  Additional Follow-up for Phone Call Additional follow up Details #1::        Pt. said she was seen at High [facility] at [facility].  She called [facility] and was told they had a High risk Dr. there and she was going to bring her records there. If they request a referral due to her Medicaid is that OK Additional Follow-up by: Wendall Mola CMA Duncan Dull),  November 06, 2009 3:04 PM    Additional Follow-up for Phone Call Additional follow up Details #2::    ok as long as they have experience with HIV (+) women and know the protocols Follow-up by: Yisroel Ramming MD,  November 06, 2009 3:12 PM  Additional Follow-up for Phone Call Additional follow up Details #3:: Details for Additional Follow-up Action Taken: [facility] [phone], pt. is to bring her records.  Dr. will review and notify pt. if accepted and apt. date.  Notified pt. and she will bring them her records on Monday 11/17/09.  They do not accept anyone after 32 weeks. Additional Follow-up by: Wendall Mola CMA Duncan Dull),  November 14, 2009 3:55 PM

## 2010-06-30 NOTE — Progress Notes (Signed)
Summary: Pt. @ [facility] w/ fever, delivered c-section 8/22 @ [place]  Phone Note Other Incoming   Caller: [facility]-  Summary of Call: Pt. hospitalized with fever.  Delivered at [facility]. on Aug. 22, 2011 via C-section. Jennet Maduro RN  January 26, 2010 10:10 AM

## 2010-06-30 NOTE — Miscellaneous (Signed)
Summary: ROI  ROI   Imported By: De Nurse 04/09/2010 11:38:39  _____________________________________________________________________  External Attachment:    Type:   Image     Comment:   External Document

## 2010-06-30 NOTE — Assessment & Plan Note (Signed)
Summary: F/U [MKJ]   Primary Provider:  Pearlean Brownie MD  CC:  follow-up visit, lab results, and pt. thinks she has yeast infection from antibiotics from dentist.  History of Present Illness: Pt here for f/u. She is currently [redacted] weeks pregnant.  She is tolerating her combivir and Kaletra well. She had a bad experience at high risk OB the last time she was there and was looking at transfering care but now has trouble traveling and has trouble finding someone in [place] who has experience with HIV (+) women. She was concerned that the OB she saw did not keep her confidentiality and did not know that she could have a vaginal delivery if her VL was undetectable.  Pt wants a c-section but just felt uncomfortable that the person she saw did not seem to be know about HIV. Pt c/o some vaginal itching and discharge.  Preventive Screening-Counseling & Management  Alcohol-Tobacco     Alcohol drinks/day: 0     Smoking Status: never  Caffeine-Diet-Exercise     Caffeine use/day: tea     Does Patient Exercise: yes     Times/week: 3  Safety-Violence-Falls     Seat Belt Use: 100      Sexual History:  n/a.        Drug Use:  never.        Blood Transfusions:  no.        Travel History:  none.    Comments: pt. declined condoms   Updated Prior Medication List: KALETRA 200-50 MG TABS (LOPINAVIR-RITONAVIR) Take 2  tablets by mouth two times a day COMBIVIR 150-300 MG TABS (LAMIVUDINE-ZIDOVUDINE) Take 1 tablet by mouth two times a day PRENATAL VITAMINS 0.8 MG TABS (PRENATAL MULTIVIT-MIN-FE-FA) Take 1 tablet by mouth once a day  Current Allergies (reviewed today): ! SULFA CODEINE PHOSPHATE (CODEINE PHOSPHATE) KEFLEX (CEPHALEXIN) Past History:  Past Medical History: Last updated: 07/04/2008 Depression Pregnancy with termination January 2010  Review of Systems  The patient denies anorexia, fever, and abdominal pain.    Vital Signs:  Patient profile:   30 year old  female Menstrual status:  regular Height:      65 inches (165.10 cm) Weight:      171.12 pounds (77.78 kg) BMI:     28.58 Temp:     98.0 degrees F (36.67 degrees C) oral Pulse rate:   88 / minute BP sitting:   108 / 70  (right arm)  Vitals Entered By: Wendall Mola CMA Duncan Dull) (November 13, 2009 3:47 PM) CC: follow-up visit, lab results, pt. thinks she has yeast infection from antibiotics from dentist Is Patient Diabetic? No Pain Assessment Patient in pain? no      Nutritional Status BMI of 25 - 29 = overweight Nutritional Status Detail appetite "good"  Does patient need assistance? Functional Status Self care Ambulation Normal Comments pt. has missed several doses of HAART meds since last visit   Physical Exam  General:  alert, well-developed, well-nourished, and well-hydrated.   Head:  normocephalic and atraumatic.   Mouth:  pharynx pink and moist.   Abdomen:  pregnant abdomen    Impression & Recommendations:  Problem # 1:  HIV DISEASE (ICD-042) Pt currently [redacted] weeks pregnant.  VL <48.  Could undergo vaginal delivery safely it she chooses and there is not other reason to have a c-section.  Pt wishes to have a c-section at this time. Pt to continue curent meds and f/u in  weeks for repeat labs.  Once the  baby is delivered she is to schedule an appt to talk about treatment options.  Orders: Est. Patient Level III (75643) T-GC Probe, urine [id number]) T-Chlamydia  Probe, urine [phone])  Diagnostics Reviewed:  HIV: HIV positive - not AIDS (11/20/2008)   HIV-Western blot: Positive (12/20/2006)   CD4: 540 (10/29/2009)   WBC: 4.1 (10/28/2009)   Hgb: 10.2 (10/28/2009)   HCT: 30.3 (10/28/2009)   Platelets: 258 (10/28/2009) HIV genotype: * (07/11/2009)   HIV-1 RNA: <48 copies/mL (10/28/2009)   HBSAg: NEG (07/09/2009)  Problem # 2:  PREGNANCY, HIGH RISK (ICD-V23.9) encouraged pt to return to high risk OB Pt requested that I send them information regarding her  current labs.  Problem # 3:  VAGINITIS, BACTERIAL (ICD-616.10)  Orders: T-Wet Prep by Molecular Probe [phone]) T-GC Probe, urine [phone]) T-Chlamydia  Probe, urine [phone])  Other Orders: Future Orders: T-CD4SP ([facility]) (CD4SP) ... 12/25/2009 T-HIV Viral Load [phone]) ... 12/25/2009 T-Comprehensive Metabolic Panel [phone]) ... 12/25/2009 T-CBC w/Diff ([id number]) ... 12/25/2009  Patient Instructions: 1)  Please schedule a follow-up appointment in 8 weeks, 2 weeks after labs.

## 2010-06-30 NOTE — Assessment & Plan Note (Signed)
Summary: newly pregnant, OK per Dr. Carmon Sails   Primary Provider:  Pearlean Brownie MD  CC:  Positive pregnancy test  and [facility] last week .  History of Present Illness: Pt recently found out she was pregnant.  She believes that she is just over [redacted] weeks pregnant. She came in for labs and was fit into the schedule.   Flu Vaccine Consent Questions     Do you have a history of severe allergic reactions to this vaccine? no    Any prior history of allergic reactions to egg and/or gelatin? no    Do you have a sensitivity to the preservative Thimersol? no    Do you have a past history of Guillan-Barre Syndrome? no    Do you currently have an acute febrile illness? no    Have you ever had a severe reaction to latex? no    Vaccine information given and explained to patient? yes    Are you currently pregnant? no    Lot [id number]   Exp Date:08/28/2009   Manufacturer: [employer]    Site Given  Left Deltoid IM Starleen Arms CMA  July 11, 2009 11:57 AM   Preventive Screening-Counseling & Management  Alcohol-Tobacco     Alcohol drinks/day: 0     Smoking Status: never   Updated Prior Medication List: CLARITIN 10 MG  TABS (LORATADINE) Take 1 tablet by mouth once a day FLONASE 50 MCG/ACT  SUSP (FLUTICASONE PROPIONATE) one spray two times a day OMEPRAZOLE 20 MG CPDR (OMEPRAZOLE) one by mouth daily DIFLUCAN 150 MG TABS (FLUCONAZOLE) 1 by mouth once  for 2 days METRONIDAZOLE 500 MG TABS (METRONIDAZOLE) 4 tablets by mouth x 1 - do not drink alcohol while taking DOXYCYCLINE HYCLATE 100 MG CAPS (DOXYCYCLINE HYCLATE) 1 tablet by mouth two times a day 30 min prior to meals x 7 days RHINOCORT AQUA 32 MCG/ACT SUSP (BUDESONIDE (NASAL)) 1 spray each nostril once daily 1  Current Allergies (reviewed today): ! SULFA CODEINE PHOSPHATE (CODEINE PHOSPHATE) KEFLEX (CEPHALEXIN) Past History:  Past Medical History: Last updated: 07/04/2008 Depression Pregnancy with termination  January 2010  Review of Systems  The patient denies anorexia, fever, and weight loss.    Vital Signs:  Patient profile:   30 year old female Menstrual status:  regular Height:      65 inches Weight:      15.1 pounds Temp:     97.1 degrees F Pulse rate:   83 / minute BP sitting:   112 / 66  Vitals Entered By: Tomasita Morrow RN (July 11, 2009 11:10 AM) CC: Positive pregnancy test , [facility] last week  Is Patient Diabetic? No Pain Assessment Patient in pain? no      Nutritional Status Detail normal  Have you ever been in a relationship where you felt threatened, hurt or afraid?No  Domestic Violence Intervention none  Does patient need assistance? Functional Status Self care Ambulation Normal   Physical Exam  General:  alert, well-developed, well-nourished, and well-hydrated.   Head:  normocephalic and atraumatic.   Mouth:  pharynx pink and moist.   Lungs:  normal breath sounds.     Impression & Recommendations:  Problem # 1:  HIV DISEASE (ICD-042) Pt currently pregnant. We discused the need for treatment to prevent transmission to her baby.  She states that she is ready to start treatment.  I will obtain a VL and genotype and have her return in 3 weeks. Orders: Est. Patient Level III (84696)  Other Orders:  Flu Vaccine 42yrs + (04540) Administration Flu vaccine - MCR (J8119)  Patient Instructions: 1)  Please schedule a follow-up appointment in 2 -3 weeks.

## 2010-06-30 NOTE — Progress Notes (Signed)
Summary: PPD  Phone Note Outgoing Call   Call placed by: Annice Pih Summary of Call: Pt. needs PPD at next office visit Initial call taken by: Wendall Mola CMA Duncan Dull),  February 20, 2010 12:48 PM

## 2010-06-30 NOTE — Progress Notes (Signed)
Summary: drug not covered  Phone Note Call from Patient   Caller: Patient Details for Reason: drug not covered Summary of Call: Pateint called and stated that she was given a prescription upon discharge for an antibiotic.  she said she went to get it filled and was told it needed a prior authorization.  I asked her which pharmacy  because I will need that statement in order to call Medicaid to see if they will pay the claim.  She [facility] [address]  Her call back number is [phone]. Initial call taken by: Paulo Fruit  BS,CPht II,MPH,  January 29, 2010 9:51 AM  Follow-up for Phone Call        Patient called again today with the pharmacist on the phone.  They are sending the fax manually to [place] office.  Will try to assit patient once again. Follow-up by: Paulo Fruit  BS,CPht II,MPH,  January 30, 2010 12:29 PM  Additional Follow-up for Phone Call Additional follow up Details #1::        We did receive the fax request after speaking with patient and pharmacist; however the triage nurse said that request should go to [facility].   A message was left on the triage nurse line and the fax request faxed to [phone].  Patient was called and informed that she can follow up with [facility]. Additional Follow-up by: Paulo Fruit  BS,CPht II,MPH,  January 30, 2010 2:29 PM     Appended Document: drug not covered Patient was called however I had to leave her a message to call us as soon as she can before 5pm today about her request.  Appended Document: drug not covered Dr. Rueben Bash fax # is [phone].  Faxing prior-authoriztion to that number.    Appended Document: drug not covered correction fax # is [phone].

## 2010-06-30 NOTE — Miscellaneous (Signed)
Summary: Prenatal vitamin  Clinical Lists Changes  Medications: Added new medication of PRENATAL VITAMINS 0.8 MG TABS (PRENATAL MULTIVIT-MIN-FE-FA) Take 1 tablet by mouth once a day - Signed Rx of PRENATAL VITAMINS 0.8 MG TABS (PRENATAL MULTIVIT-MIN-FE-FA) Take 1 tablet by mouth once a day;  #31 x 11;  Signed;  Entered by: Jennet Maduro RN;  Authorized by: Yisroel Ramming MD;  Method used: Electronically to [facility] [address], [place], [place]  [id number], Ph: [phone], Fax: [phone] Rx of PRENATAL VITAMINS 0.8 MG TABS (PRENATAL MULTIVIT-MIN-FE-FA) Take 1 tablet by mouth once a day;  #31 x 11;  Signed;  Entered by: Jennet Maduro RN;  Authorized by: Yisroel Ramming MD;  Method used: [facility] [address] [phone]*, [address] [place], [place], [place]  [address], Ph: [phone] or [phone], Fax: [phone] Rx of PRENATAL VITAMINS 0.8 MG TABS (PRENATAL MULTIVIT-MIN-FE-FA) Take 1 tablet by mouth once a day;  #31 x 11;  Signed;  Entered by: Jennet Maduro RN;  Authorized by: Yisroel Ramming MD;  Method used: Electronically to [facility] [address] [phone]*, [address] [place], [place], [place]  [address], Ph: [phone] or [phone], Fax: [phone]    Prescriptions: PRENATAL VITAMINS 0.8 MG TABS (PRENATAL MULTIVIT-MIN-FE-FA) Take 1 tablet by mouth once a day  #31 x 11   Entered by:   Jennet Maduro RN   Authorized by:   Yisroel Ramming MD   Signed by:   Jennet Maduro RN on 08/14/2009   Method used:   Electronically to        [facility] [address] [phone]* (retail)       [address]       [place], [place]  [address]       Ph: [phone] or [phone]       Fax: [phone]   RxID:   [id number] PRENATAL VITAMINS 0.8 MG TABS (PRENATAL MULTIVIT-MIN-FE-FA) Take 1 tablet by mouth once a day  #31 x 11   Entered by:   Jennet Maduro RN   Authorized by:   Yisroel Ramming MD   Signed  by:   Jennet Maduro RN on 08/14/2009   Method used:   Electronically to        [facility] [address] [phone]* (retail)       [address]       [place], [place]  [address]       Ph: [phone] or [phone]       Fax: [phone]   RxID:   [id number] PRENATAL VITAMINS 0.8 MG TABS (PRENATAL MULTIVIT-MIN-FE-FA) Take 1 tablet by mouth once a day  #31 x 11   Entered by:   Jennet Maduro RN   Authorized by:   Yisroel Ramming MD   Signed by:   Jennet Maduro RN on 08/14/2009   Method used:   Electronically to        [facility] (retail)       [address]       [place], [place]  [id number]       Ph: [phone]       Fax: [phone]   RxID:   [id number]  resent [facility] [address].  Jennet Maduro RN  August 14, 2009 3:11 PM

## 2010-06-30 NOTE — Progress Notes (Signed)
Summary: patient w/o ob doctor  Phone Note Call from Patient   Summary of Call: Patient called stating she can no longer be seen at [facility] for high risk obgn. She tried to get into another high risk clinic and was denied. I advised the patient to check with her obgyn that she went to prior to her pregnancy, if she couldn't get in to another high risk clinic. Initial call taken by: Starleen Arms CMA,  November 25, 2009 9:46 AM     Appended Document: patient w/o ob doctor I was able to get Yolanda Hatfield in to the high risk clinic at [facility], she has an appointment 11/03/2009 at 2:00pm the patient is aware. Starleen Arms, CMA 11/26/2009  Appended Document: patient w/o ob doctor I made an error on the first append, the patient will be seen at [facility] risk on 12/03/2009 instead of 11/03/2009.Starleen Arms

## 2010-06-30 NOTE — Miscellaneous (Signed)
Summary: Yolanda Hatfield Flowsheet updated w/ pregnancy info.  Clinical Lists Changes  Observations: Added new observation of HIVMEDPREG: Yes (10/06/2009 17:12) Added new observation of TRIMESTRCARE: First (10/06/2009 17:12) Added new observation of PREGTHISYR: Yes (10/06/2009 17:12)

## 2010-06-30 NOTE — Miscellaneous (Signed)
Summary: [facility]   Imported By: Florinda Marker 12/25/2009 16:24:35  _____________________________________________________________________  External Attachment:    Type:   Image     Comment:   External Document

## 2010-06-30 NOTE — Assessment & Plan Note (Signed)
Summary: followup from c-section/jc   Primary Giles Currie:  Yolanda Brownie MD  CC:  Hosp. f/u from c-section is on Cipro for infection near bladder.  History of Present Illness: Pt here for f/u from hospitalization for abdominal pain.CT scan showed a questionable abcess for which she was treated with antibiotics for. The pain has resolved.  No fever. Pt continues on Kaletra and combivir. Baby is HIV negative thus far and is doing well.  Preventive Screening-Counseling & Management  Alcohol-Tobacco     Alcohol drinks/day: 0     Smoking Status: never  Caffeine-Diet-Exercise     Caffeine use/day: tea     Does Patient Exercise: yes     Type of exercise: gym     Times/week: 3  Safety-Violence-Falls     Seat Belt Use: 100      Sexual History:  currently monogamous.        Drug Use:  never.        Blood Transfusions:  no.        Travel History:  none.    Comments: pt. declined condoms   Updated Prior Medication List: KALETRA 200-50 MG TABS (LOPINAVIR-RITONAVIR) Take 2  tablets by mouth two times a day COMBIVIR 150-300 MG TABS (LAMIVUDINE-ZIDOVUDINE) Take 1 tablet by mouth two times a day PRENATAL VITAMINS 0.8 MG TABS (PRENATAL MULTIVIT-MIN-FE-FA) Take 1 tablet by mouth once a day  Current Allergies (reviewed today): ! SULFA CODEINE PHOSPHATE (CODEINE PHOSPHATE) KEFLEX (CEPHALEXIN) Past History:  Past Medical History: Last updated: 07/04/2008 Depression Pregnancy with termination January 2010  Social History: Sexual History:  currently monogamous  Review of Systems  The patient denies anorexia, fever, weight loss, and abdominal pain.    Vital Signs:  Patient profile:   30 year old female Menstrual status:  regular Height:      65 inches (165.10 cm) Weight:      157.0 pounds (71.36 kg) BMI:     26.22 Temp:     98.2 degrees F (36.78 degrees C) oral Pulse rate:   73 / minute BP sitting:   117 / 73  (right arm)  Vitals Entered By: Wendall Mola CMA Duncan Dull) (February 10, 2010 4:07 PM) CC: Hosp. f/u from c-section is on Cipro for infection near bladder Is Patient Diabetic? No Pain Assessment Patient in pain? no      Nutritional Status BMI of 25 - 29 = overweight Nutritional Status Detail appetite "fine"  Does patient need assistance? Functional Status Self care Ambulation Normal Comments no missed doses of meds per patient   Physical Exam  General:  alert, well-developed, well-nourished, and well-hydrated.   Head:  normocephalic and atraumatic.   Mouth:  pharynx pink and moist.   Lungs:  normal breath sounds.     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 Orders: Est. Patient Level III (99213)Future Orders: T-CBC w/Diff ([id number]) ... 03/09/2010 T-CD4SP ([facility]) (CD4SP) ... 03/09/2010 T-Comprehensive Metabolic Panel [phone]) ... 03/09/2010 T-HIV Viral Load [phone]) ... 03/09/2010  Diagnostics Reviewed:  HIV: HIV positive - not AIDS (11/20/2008)   HIV-Western blot: Positive (12/20/2006)   CD4: 540 (10/29/2009)   WBC: 4.1 (10/28/2009)   Hgb: 10.2 (10/28/2009)   HCT: 30.3 (10/28/2009)   Platelets: 258 (10/28/2009) HIV genotype: * (07/11/2009)   HIV-1 RNA: <48 copies/mL (10/28/2009)   HBSAg: NEG (07/09/2009)  Problem # 2:  ABDOMINAL PAIN RIGHT LOWER QUADRANT (ICD-789.03) possible abcess improved on antibiotics  pt to call if sympotms returnand we will repeat CT scan  Patient Instructions: 1)  Please schedule a follow-up appointment in 6 weeks, 2 weeks after labs.

## 2010-06-30 NOTE — Assessment & Plan Note (Signed)
Summary: birth control per jessica/eo   Primary Care Provider:  Pearlean Brownie MD   History of Present Illness: Contraception would like to restart Depo.  Last menstrual period ended last friday.   No problems when took it in the past Last pap at [facility] while pregnant about 6-8 months ago.  Was normal she believes.  No history of hypertension or blood clots.  + HIV   Current Medications (verified): 1)  Kaletra 200-50 Mg Tabs (Lopinavir-Ritonavir) .... Take 2  Tablets By Mouth Two Times A Day 2)  Combivir 150-300 Mg Tabs (Lamivudine-Zidovudine) .... Take 1 Tablet By Mouth Two Times A Day 3)  Prenatal Vitamins 0.8 Mg Tabs (Prenatal Multivit-Min-Fe-Fa) .... Take 1 Tablet By Mouth Once A Day  Allergies: 1)  ! Sulfa 2)  Codeine Phosphate (Codeine Phosphate) 3)  Keflex (Cephalexin)  Physical Exam  General:  Well-developed,well-nourished,in no acute distress; alert,appropriate and cooperative throughout examination   Impression & Recommendations:  Problem # 1:  CONTRACEPTIVE MANAGEMENT (ICD-V25.09) will start depo.   Discussed need for weight bearing exercise and liberal calcium intake.  She may be interested in IUD in future.  Discussed need for condoms since HIV +  Orders: U Preg-[facility] (16109) Depo-Provera 150mg  (J1055) [facility]- Est Level  3 (60454)  Complete Medication List: 1)  Kaletra 200-50 Mg Tabs (Lopinavir-ritonavir) .... Take 2  tablets by mouth two times a day 2)  Combivir 150-300 Mg Tabs (Lamivudine-zidovudine) .... Take 1 tablet by mouth two times a day 3)  Prenatal Vitamins 0.8 Mg Tabs (Prenatal multivit-min-fe-fa) .... Take 1 tablet by mouth once a day   Medication Administration  Injection # 1:    Medication: Depo-Provera 150mg     Diagnosis: CONTRACEPTIVE MANAGEMENT (ICD-V25.09)    Route: IM    Site: RUOQ gluteus    Exp Date: 07/01/2012    Lot #: [id number]    Mfr: [email or web address]    Comments: Pt upreg is negative.  She is post partum and has not continued intercourse.   No second upreg needed.  She was informed to RTC 1.23.12 - 2.6.12    Patient tolerated injection without complications    Given by: Jone Baseman CMA (April 06, 2010 3:36 PM)  Orders Added: 1)  U Preg-[facility] [81025] 2)  Depo-Provera 150mg  [J1055] 3)  [facility]- Est Level  3 [99213]    Laboratory Results   Urine Tests  Date/Time Received: April 06, 2010 2:43 PM   Date/Time Reported: April 06, 2010 2:47 PM     Urine HCG: negative Comments: ............................................... Delora Fuel April 06, 2010 2:48 PM      Medication Administration  Injection # 1:    Medication: Depo-Provera 150mg     Diagnosis: CONTRACEPTIVE MANAGEMENT (ICD-V25.09)    Route: IM    Site: RUOQ gluteus    Exp Date: 07/01/2012    Lot #: [id number]    Mfr: [email or web address]    Comments: Pt upreg is negative.  She is post partum and has not continued intercourse.  No second upreg needed.  She was informed to RTC 1.23.12 - 2.6.12    Patient tolerated injection without complications    Given by: Jone Baseman CMA (April 06, 2010 3:36 PM)  Orders Added: 1)  U Preg-[facility] [81025] 2)  Depo-Provera 150mg  [J1055] 3)  [facility]- Est Level  3 [56213]

## 2010-07-02 NOTE — Miscellaneous (Signed)
  Clinical Lists Changes  Observations: Added new observation of #CHILDDELHIV: 0  (06/11/2010 15:25)

## 2010-07-08 ENCOUNTER — Ambulatory Visit (INDEPENDENT_AMBULATORY_CARE_PROVIDER_SITE_OTHER): Payer: Medicaid Other | Admitting: *Deleted

## 2010-07-08 DIAGNOSIS — Z309 Encounter for contraceptive management, unspecified: Secondary | ICD-10-CM | POA: Insufficient documentation

## 2010-07-08 LAB — POCT URINE PREGNANCY: Preg Test, Ur: NEGATIVE

## 2010-07-08 MED ORDER — MEDROXYPROGESTERONE ACETATE 150 MG/ML IM SUSP
150.0000 mg | Freq: Once | INTRAMUSCULAR | Status: AC
  Administered 2010-07-08: 150 mg via INTRAMUSCULAR

## 2010-07-15 ENCOUNTER — Encounter: Payer: Self-pay | Admitting: Family Medicine

## 2010-07-15 ENCOUNTER — Ambulatory Visit (INDEPENDENT_AMBULATORY_CARE_PROVIDER_SITE_OTHER): Payer: Medicaid Other | Admitting: Family Medicine

## 2010-07-15 ENCOUNTER — Other Ambulatory Visit: Payer: Self-pay | Admitting: Family Medicine

## 2010-07-15 ENCOUNTER — Other Ambulatory Visit (HOSPITAL_COMMUNITY)
Admission: RE | Admit: 2010-07-15 | Discharge: 2010-07-15 | Disposition: A | Discharge status: Home / Self Care | Payer: Medicaid Other | Source: Ambulatory Visit | Attending: Family Medicine | Admitting: Family Medicine

## 2010-07-15 VITALS — BP 116/79 | HR 81 | Temp 98.2°F | Ht 66.0 in | Wt 158.9 lb

## 2010-07-15 DIAGNOSIS — Z01419 Encounter for gynecological examination (general) (routine) without abnormal findings: Secondary | ICD-10-CM

## 2010-07-15 DIAGNOSIS — Z309 Encounter for contraceptive management, unspecified: Secondary | ICD-10-CM

## 2010-07-15 DIAGNOSIS — Z209 Contact with and (suspected) exposure to unspecified communicable disease: Secondary | ICD-10-CM

## 2010-07-15 DIAGNOSIS — J069 Acute upper respiratory infection, unspecified: Secondary | ICD-10-CM

## 2010-07-15 DIAGNOSIS — Z124 Encounter for screening for malignant neoplasm of cervix: Secondary | ICD-10-CM

## 2010-07-15 NOTE — Assessment & Plan Note (Signed)
Seems to be simple viral URI.  Despite HIV No signs or symptoms of bacterial infection of lungs or sinuses or more widespread infection

## 2010-07-15 NOTE — Progress Notes (Signed)
  Subjective:    Patient ID: Yolanda Hatfield, female    DOB: [birth year], 30 y.o.   MRN: [id number]  HPI  URI  Cold symptoms of runny nose, congestion, ear fullness, dry cough, muscle aches with fever for two day but none last 36 hours.  No chest pain or sputum or rash or shortness of breath.  Eating ok  Pap Smear No vaginal symptoms.  Last pap while pregnant reportedly normal  Review of Systems  Constitutional: Negative for chills and fatigue.  HENT: Negative for hearing loss, ear pain, neck stiffness and ear discharge.   Respiratory: Negative for choking, shortness of breath and wheezing.   Cardiovascular: Negative for chest pain and leg swelling.  Gastrointestinal: Negative for abdominal distention.  Genitourinary: Negative for dysuria, genital sores and menstrual problem.  Musculoskeletal: Negative for back pain and joint swelling.  Neurological: Positive for headaches. Negative for dizziness.  Psychiatric/Behavioral: Negative for confusion and agitation.       Objective:   Physical Exam    Genitalia:  Normal introitus for age, no external lesions, no vaginal discharge, mucosa pink and moist, no vaginal or cervical lesions, no vaginal atrophy, no friaility or hemorrhage, normal uterus size and position, no adnexal masses or tenderness  Heart:  Normal rate and regular rhythm. S1 and S2 normal without gallop, murmur, click, rub or other extra sounds  Lungs:  Normal respiratory effort, chest expands symmetrically. Lungs are clear to auscultation, no crackles or wheezes. Nose:  External nasal examination shows no deformity or inflammation. Nasal mucosa are slightly red with clear mucous Neck:  No deformities, thyromegaly, masses, or tenderness noted.   Full range of motion with pain.   Assessment & Plan:   Normal physical exam.   See AVS for recommendations

## 2010-07-15 NOTE — Patient Instructions (Signed)
Follow up with ID clinic as scheduled for regular checkups and blood tests If your cold is not better in 7 days or if you get fever again or have shortness of breath or feel really bad come back To keep yourself healthy you should exercise regularly and maintain your healthy weight

## 2010-07-16 LAB — GC/CHLAMYDIA PROBE AMP, GENITAL
Chlamydia, DNA Probe: NEGATIVE
GC Probe Amp, Genital: NEGATIVE

## 2010-08-11 LAB — T-HELPER CELL (CD4) - (RCID CLINIC ONLY): CD4 T Cell Abs: 840 uL (ref 400–2700)

## 2010-08-13 ENCOUNTER — Other Ambulatory Visit: Payer: Medicaid Other

## 2010-08-14 LAB — CULTURE, BLOOD (ROUTINE X 2): Culture: NO GROWTH

## 2010-08-14 LAB — CBC
MCH: 33.2 pg (ref 26.0–34.0)
MCH: 33.4 pg (ref 26.0–34.0)
MCH: 34 pg (ref 26.0–34.0)
MCHC: 32.7 g/dL (ref 30.0–36.0)
MCV: 101.9 fL — ABNORMAL HIGH (ref 78.0–100.0)
MCV: 102.4 fL — ABNORMAL HIGH (ref 78.0–100.0)
Platelets: 256 10*3/uL (ref 150–400)
Platelets: 273 10*3/uL (ref 150–400)
Platelets: 306 10*3/uL (ref 150–400)
RDW: 14.3 % (ref 11.5–15.5)
RDW: 14.4 % (ref 11.5–15.5)

## 2010-08-14 LAB — DIFFERENTIAL
Basophils Absolute: 0 10*3/uL (ref 0.0–0.1)
Basophils Relative: 0 % (ref 0–1)
Basophils Relative: 1 % (ref 0–1)
Eosinophils Absolute: 0 10*3/uL (ref 0.0–0.7)
Eosinophils Absolute: 0.1 10*3/uL (ref 0.0–0.7)
Eosinophils Relative: 1 % (ref 0–5)
Neutrophils Relative %: 85 % — ABNORMAL HIGH (ref 43–77)
Neutrophils Relative %: 85 % — ABNORMAL HIGH (ref 43–77)

## 2010-08-14 LAB — URINE MICROSCOPIC-ADD ON

## 2010-08-14 LAB — URINE CULTURE

## 2010-08-14 LAB — MRSA PCR SCREENING: MRSA by PCR: NEGATIVE

## 2010-08-14 LAB — URINALYSIS, ROUTINE W REFLEX MICROSCOPIC
Bilirubin Urine: NEGATIVE
Glucose, UA: NEGATIVE mg/dL
Nitrite: NEGATIVE
Specific Gravity, Urine: 1.02 (ref 1.005–1.030)
pH: 6 (ref 5.0–8.0)

## 2010-08-14 LAB — CREATININE, SERUM: GFR calc Af Amer: >60 mL/min (ref >60)

## 2010-08-17 LAB — POCT URINALYSIS DIP (DEVICE)
Hgb urine dipstick: NEGATIVE
Nitrite: NEGATIVE
Protein, ur: 30 mg/dL — AB
Urobilinogen, UA: 1 mg/dL (ref 0.0–1.0)
pH: 6 (ref 5.0–8.0)

## 2010-08-17 LAB — CBC
HCT: 27.2 % — ABNORMAL LOW (ref 36.0–46.0)
Platelets: 198 10*3/uL (ref 150–400)
RDW: 15.7 % — ABNORMAL HIGH (ref 11.5–15.5)

## 2010-08-17 LAB — URINALYSIS, ROUTINE W REFLEX MICROSCOPIC
Bilirubin Urine: NEGATIVE
Ketones, ur: NEGATIVE mg/dL
Nitrite: NEGATIVE
Protein, ur: NEGATIVE mg/dL
Specific Gravity, Urine: >1.03 — ABNORMAL HIGH (ref 1.005–1.030)
Urobilinogen, UA: 0.2 mg/dL (ref 0.0–1.0)

## 2010-08-17 LAB — COMPREHENSIVE METABOLIC PANEL
Albumin: 2.7 g/dL — ABNORMAL LOW (ref 3.5–5.2)
Alkaline Phosphatase: 37 U/L — ABNORMAL LOW (ref 39–117)
BUN: 8 mg/dL (ref 6–23)
Calcium: 8.4 mg/dL (ref 8.4–10.5)
Potassium: 3.7 mEq/L (ref 3.5–5.1)
Sodium: 135 mEq/L (ref 135–145)
Total Protein: 5.8 g/dL — ABNORMAL LOW (ref 6.0–8.3)

## 2010-08-17 LAB — WET PREP, GENITAL
Clue Cells Wet Prep HPF POC: NONE SEEN
Trich, Wet Prep: NONE SEEN

## 2010-08-17 LAB — GC/CHLAMYDIA PROBE AMP, GENITAL: Chlamydia, DNA Probe: NEGATIVE

## 2010-08-18 LAB — POCT URINALYSIS DIP (DEVICE)
Hgb urine dipstick: NEGATIVE
Nitrite: NEGATIVE
Specific Gravity, Urine: 1.025 (ref 1.005–1.030)
Urobilinogen, UA: 0.2 mg/dL (ref 0.0–1.0)
pH: 6 (ref 5.0–8.0)

## 2010-08-19 LAB — POCT URINALYSIS DIP (DEVICE)
Bilirubin Urine: NEGATIVE
Ketones, ur: NEGATIVE mg/dL
Protein, ur: NEGATIVE mg/dL
Specific Gravity, Urine: 1.025 (ref 1.005–1.030)
pH: 7 (ref 5.0–8.0)

## 2010-08-21 LAB — POCT PREGNANCY, URINE: Preg Test, Ur: POSITIVE

## 2010-08-21 LAB — URINALYSIS, ROUTINE W REFLEX MICROSCOPIC
Glucose, UA: NEGATIVE mg/dL
Hgb urine dipstick: NEGATIVE
Ketones, ur: NEGATIVE mg/dL
Protein, ur: NEGATIVE mg/dL
pH: 6.5 (ref 5.0–8.0)

## 2010-08-21 LAB — WET PREP, GENITAL
Trich, Wet Prep: NONE SEEN
Yeast Wet Prep HPF POC: NONE SEEN

## 2010-08-21 LAB — POCT URINALYSIS DIP (DEVICE)
Bilirubin Urine: NEGATIVE
Hgb urine dipstick: NEGATIVE
Nitrite: NEGATIVE
Protein, ur: 30 mg/dL — AB
Urobilinogen, UA: 1 mg/dL (ref 0.0–1.0)
pH: 6.5 (ref 5.0–8.0)

## 2010-08-21 LAB — HCG, QUANTITATIVE, PREGNANCY: hCG, Beta Chain, Quant, S: 241336 m[IU]/mL — ABNORMAL HIGH (ref <5)

## 2010-08-21 LAB — CBC
HCT: 32 % — ABNORMAL LOW (ref 36.0–46.0)
MCV: 93.1 fL (ref 78.0–100.0)
Platelets: 232 10*3/uL (ref 150–400)
RDW: 13.7 % (ref 11.5–15.5)
WBC: 3.2 10*3/uL — ABNORMAL LOW (ref 4.0–10.5)

## 2010-08-21 LAB — GC/CHLAMYDIA PROBE AMP, GENITAL: Chlamydia, DNA Probe: NEGATIVE

## 2010-08-24 LAB — URINALYSIS, ROUTINE W REFLEX MICROSCOPIC
Bilirubin Urine: NEGATIVE
Ketones, ur: NEGATIVE mg/dL
Nitrite: NEGATIVE
Protein, ur: NEGATIVE mg/dL
pH: 6.5 (ref 5.0–8.0)

## 2010-08-24 LAB — POCT URINALYSIS DIP (DEVICE)
Ketones, ur: NEGATIVE mg/dL
Nitrite: NEGATIVE
Protein, ur: 30 mg/dL — AB
Urobilinogen, UA: 1 mg/dL (ref 0.0–1.0)
pH: 6 (ref 5.0–8.0)

## 2010-09-23 ENCOUNTER — Other Ambulatory Visit (INDEPENDENT_AMBULATORY_CARE_PROVIDER_SITE_OTHER): Payer: Medicaid Other

## 2010-09-23 DIAGNOSIS — B20 Human immunodeficiency virus [HIV] disease: Secondary | ICD-10-CM

## 2010-09-24 ENCOUNTER — Ambulatory Visit (INDEPENDENT_AMBULATORY_CARE_PROVIDER_SITE_OTHER): Payer: Medicaid Other | Admitting: *Deleted

## 2010-09-24 DIAGNOSIS — Z309 Encounter for contraceptive management, unspecified: Secondary | ICD-10-CM

## 2010-09-24 LAB — COMPLETE METABOLIC PANEL WITH GFR
Alkaline Phosphatase: 35 U/L — ABNORMAL LOW (ref 39–117)
BUN: 12 mg/dL (ref 6–23)
Creat: 0.76 mg/dL (ref 0.40–1.20)
GFR, Est African American: >60 mL/min (ref >60)
GFR, Est Non African American: >60 mL/min (ref >60)
Glucose, Bld: 89 mg/dL (ref 70–99)
Total Bilirubin: 0.3 mg/dL (ref 0.3–1.2)

## 2010-09-24 LAB — T-HELPER CELL (CD4) - (RCID CLINIC ONLY): CD4 T Cell Abs: 360 uL — ABNORMAL LOW (ref 400–2700)

## 2010-09-24 LAB — CBC
HCT: 36.8 % (ref 36.0–46.0)
Hemoglobin: 11.9 g/dL — ABNORMAL LOW (ref 12.0–15.0)
MCH: 30.4 pg (ref 26.0–34.0)
MCHC: 32.3 g/dL (ref 30.0–36.0)
MCV: 94.1 fL (ref 78.0–100.0)

## 2010-09-24 MED ORDER — MEDROXYPROGESTERONE ACETATE 150 MG/ML IM SUSP
150.0000 mg | Freq: Once | INTRAMUSCULAR | Status: AC
  Administered 2010-09-24: 150 mg via INTRAMUSCULAR

## 2010-09-25 LAB — HIV-1 RNA QUANT-NO REFLEX-BLD: HIV 1 RNA Quant: 15300 copies/mL — ABNORMAL HIGH (ref <20)

## 2010-09-29 ENCOUNTER — Other Ambulatory Visit: Payer: Self-pay | Admitting: Family Medicine

## 2010-10-16 NOTE — Discharge Summary (Signed)
[facility]  Patient:    Yolanda Hatfield                       MRN: [id number] Adm. Date:  [id number] Disc. Date: [id number] Attending:  Mickle Mallory Dictator:   Leilani Able, P.A.                           Discharge Summary  FINAL DIAGNOSES:              1. Term pregnancy.                               2. Desire for repeat cesarean section.                               3. The patient with history of previous cesarean                                  section.  PROCEDURE:                    Repeat low transverse cesarean section and lysis f adhesions.  SURGEON:                      Gerrit Friends. Aldona Bar, M.D.  ASSISTANT:                    Caralyn Guile. Arlyce Dice, M.D.  HISTORY OF PRESENT ILLNESS:   This 30 year old, gravida 4, para 1-0-2-1, presents at term desiring a repeat cesarean section.  The patient had had her first cesarean section secondary to failure to progress.  During the patients antepartum course, it was discussed whether to proceed with a vaginal birth after cesarean section or to proceed with a repeat cesarean section.  The patients wishes are to be delivered by cesarean section for this pregnancy.  HOSPITAL COURSE:              The patient is taken to the operating room by Gerrit Friends. Aldona Bar, M.D. where a repeat low transverse cesarean section was performed with the delivery of a 6 pound 14 ounce female infant with Apgars of 9 and 9.  The patient  also had some adhesions that were lysed.  The procedure went without complications. The patients postoperative course was benign without significant fevers.  The patient was felt ready for discharge on postoperative day #3.  The patient was ent home on a regular diet, told to decrease activities, was given Tylox one every three hours as needed for pain, was given some Chromagen, iron, and was told to  follow up in the office in four weeks.  DISCHARGE LABORATORY DATA:    The patient  had a hemoglobin of 5.6 and a white blood cell count of 7.6. DD:  06/24/99 TD:  06/24/99 Job: [id number]

## 2010-10-16 NOTE — Op Note (Signed)
[facility]  Patient:    Yolanda Hatfield                       MRN: [id number] Proc. Date: 06/03/99 Adm. Date:  [id number] Attending:  Mickle Mallory                           Operative Report  PREOPERATIVE DIAGNOSIS:  Term pregnancy, desire for repeat cesarean section, previous cesarean section.  POSTOPERATIVE DIAGNOSIS:  Term pregnancy, desire for repeat cesarean section, previous cesarean section.  Delivery of viable female infant, 6 pounds 14 ounces,  Apgars 9 and 9.  OPERATION:  Repeat low transverse cesarean section and lysis of adhesions.  SURGEON:  Gerrit Friends. Aldona Bar, M.D.  ASSISTANT:  Dr. Arlyce Dice.  ANESTHESIA:  Spinal.  ESTIMATED BLOOD LOSS:  INDICATIONS:  This 30 year old gravida 4, para 1, ab 2, presents at term and desires a repeat cesarean section.  She had her first cesarean section for failure to progress.  She is taken to the operating room now according to her wishes for delivery by repeat cesarean section.  DESCRIPTION OF PROCEDURE:  The patient was taken to the operating room where after satisfactory induction of spinal anesthesia by ____________ she was prepped and  draped in the usual fashion having been placed in the supine position ____________ to the left.  A Foley catheter was inserted as part of the prep.  After good anesthesia was documented a Pfannenstiel incision was made and with minimal difficulty dissected down sharply to and through the fascia in low transverse fashion.  Hemostasis created in each layer.  Subfascial space was created inferiorly and superiorly, muscles separated in the midline, peritoneum identified and entered appropriately.  Care taken to avoid the bowel superiorly and the bladder inferiorly.  At this time significant adhesions were noted on the anterior surface of the uterus.  The bladder reflection was noted and the vesicouterine peritoneum was incised above the bladder reflection and  pushed off the lower uterine segment with ease.  Sharp incisions using Metzenbaum scissors were then  made and extended with the fingers.  Amniotomy was carried out with the fingers. The amniotomy was carried out with production of clear fluid and thereafter with minimal difficulty delivery of a viable 6 pound 14 ounce female infant carried out. The infant cried spontaneously at once and after the cord was clamped and cut, he infant was passed off to the awaiting team headed up by J. Alphonsa Gin, M.D. Subsequent Apgars noted to be 9 and 9 and the infant taken to the nursery in good condition.  Cord bloods were collected and thereafter the placenta was delivered intact. At this time, the dense adhesions on the right side of the uterus were sharply taken down and the uterus at this time exteriorized.  Further exploration revealed no  further products of conception within the cavity.  Good contractility was afforded with slowly given intravenous Pitocin and manual stimulation.  At this time the  uterine incision was closed using a single layer of #1 Vicryl in a running locking fashion.  At this time additional hemostasis was provided by several figure-of-eight #1 Vicryl sutures placed about the incision and in addition, the sites of adhesiolysis were rendered hemostatic with placement of a figure-of-eight #1 Vicryl and by use of the Bovie.  At this time the incision was noted to be dry. The tubes and ovaries were noted to  be normal.  The uterus at this time was replaced into the abdominal cavity after the abdomen was lavaged of all free blood and clots and at this time closure of the abdomen was begun after the abdominal  cavity was noted to be free of any remaining foreign bodies and all counts were  noted to be correct.  The abdominal peritoneum at this time was reapproximated using 0 Vicryl in a running fashion.  The muscles were secured with same.  We were assured of  good subfascial hemostasis.  The fascia was then reapproximated with 0 Vicryl from angle to midline bilaterally.  After assuring good hemostasis, the skin was then closed with staples and a sterile pressure dressing was applied.  The patient at this ime was transported to the recovery room in satisfactory condition having tolerated the procedure well.  At conclusion of the procedure, both mother and baby were doing well in their respective recovery areas.  All counts correct x 2.  In summary, this 30 year old gravida 4, para 1 presented at term, desires a repeat cesarean section having her first delivery by primary  cesarean section for failure to progress.  She was taken to the operating room nd delivered of a 6 pound 14 ounce female infant with Apgars of 9 and 9. DD:  06/03/99 TD:  06/03/99 Job: [id number]

## 2010-11-10 ENCOUNTER — Ambulatory Visit (INDEPENDENT_AMBULATORY_CARE_PROVIDER_SITE_OTHER): Payer: Medicaid Other | Admitting: Internal Medicine

## 2010-11-10 ENCOUNTER — Ambulatory Visit: Payer: Medicaid Other | Admitting: Internal Medicine

## 2010-11-10 ENCOUNTER — Ambulatory Visit: Payer: Medicaid Other | Admitting: Adult Health

## 2010-11-10 ENCOUNTER — Other Ambulatory Visit: Payer: Self-pay | Admitting: *Deleted

## 2010-11-10 ENCOUNTER — Encounter: Payer: Self-pay | Admitting: Internal Medicine

## 2010-11-10 VITALS — BP 130/78 | HR 89 | Temp 98.8°F | Ht 66.0 in | Wt 165.0 lb

## 2010-11-10 DIAGNOSIS — Z309 Encounter for contraceptive management, unspecified: Secondary | ICD-10-CM

## 2010-11-10 DIAGNOSIS — B20 Human immunodeficiency virus [HIV] disease: Secondary | ICD-10-CM

## 2010-11-10 DIAGNOSIS — Z113 Encounter for screening for infections with a predominantly sexual mode of transmission: Secondary | ICD-10-CM

## 2010-11-10 MED ORDER — RITONAVIR 100 MG PO CAPS: 100.0000 mg | ORAL_CAPSULE | Freq: Every day | ORAL | Status: DC

## 2010-11-10 MED ORDER — EMTRICITABINE-TENOFOVIR DF 200-300 MG PO TABS: 1.0000 | ORAL_TABLET | Freq: Every day | ORAL | Status: DC

## 2010-11-10 MED ORDER — ATAZANAVIR SULFATE 150 MG PO CAPS: 300.0000 mg | ORAL_CAPSULE | Freq: Every day | ORAL | Status: DC

## 2010-11-10 MED ORDER — FLUCONAZOLE 100 MG PO TABS: 150.0000 mg | ORAL_TABLET | Freq: Once | ORAL | Status: DC

## 2010-11-10 NOTE — Progress Notes (Signed)
  Subjective:    Patient ID: Yolanda Hatfield, female    DOB: 1980/12/22, 30 y.o.   MRN: [id number]  HPI30 yo with HIV diagnosed in 2008 and started on combivir and Kaletra in 2011 for pregnancy here for follow up. Pt has not been on meds or followed up since last year.  Tolerated the meds well and was undetectable at the time of delivery.  Now here to reestablish care.  Ready to take meds and not stop.  Birth control is with condoms.      Review of Systems  Constitutional: Negative.   HENT: Negative.   Eyes: Negative.   Respiratory: Negative.   Cardiovascular: Negative.   Gastrointestinal: Negative.   Genitourinary: Negative.   Musculoskeletal: Negative.   Skin: Negative.   Neurological: Negative.   Hematological: Negative.   Psychiatric/Behavioral: Negative.        Objective:   Physical Exam  Constitutional: She appears well-developed and well-nourished. No distress.  HENT:  Mouth/Throat: No oropharyngeal exudate.  Eyes: Right eye exhibits no discharge. Left eye exhibits no discharge. No scleral icterus.  Neck: Normal range of motion.  Cardiovascular: Normal rate, regular rhythm and normal heart sounds.  Exam reveals no gallop and no friction rub.   No murmur heard. Pulmonary/Chest: Effort normal and breath sounds normal. No respiratory distress. She has no wheezes. She has no rales.  Abdominal: Soft. Bowel sounds are normal. She exhibits no distension. There is no tenderness.  Lymphadenopathy:    She has no cervical adenopathy.  Skin: Skin is warm and dry. No erythema.  Psychiatric: She has a normal mood and affect. Her behavior is normal.          Assessment & Plan:

## 2010-11-10 NOTE — Assessment & Plan Note (Signed)
?   Yeast infection, will give dose of fluconazole.

## 2010-11-10 NOTE — Assessment & Plan Note (Signed)
I will start her on boosted Reyataz and Truvada.  I am hesitant to do other meds with her risk of pregnancy.  Will recheck in 4 weeks.  Patient may get IUD.  Counselled on avoiding H2 blockers.  Previous genotype wild type.  RPR next visit.

## 2010-11-23 ENCOUNTER — Encounter: Payer: Self-pay | Admitting: *Deleted

## 2010-11-25 ENCOUNTER — Telehealth: Payer: Self-pay | Admitting: *Deleted

## 2010-11-25 ENCOUNTER — Ambulatory Visit (INDEPENDENT_AMBULATORY_CARE_PROVIDER_SITE_OTHER): Payer: Medicaid Other | Admitting: Family Medicine

## 2010-11-25 VITALS — BP 105/69 | HR 70 | Temp 98.4°F | Ht 66.0 in | Wt 158.0 lb

## 2010-11-25 DIAGNOSIS — M549 Dorsalgia, unspecified: Secondary | ICD-10-CM

## 2010-11-25 DIAGNOSIS — R21 Rash and other nonspecific skin eruption: Secondary | ICD-10-CM

## 2010-11-25 LAB — POCT URINALYSIS DIPSTICK
Bilirubin, UA: NEGATIVE
Ketones, UA: NEGATIVE
Leukocytes, UA: NEGATIVE

## 2010-11-25 MED ORDER — HYDROCORTISONE 1 % EX OINT: TOPICAL_OINTMENT | Freq: Two times a day (BID) | CUTANEOUS | Status: DC

## 2010-11-25 MED ORDER — IBUPROFEN 600 MG PO TABS: 600.0000 mg | ORAL_TABLET | Freq: Four times a day (QID) | ORAL | Status: AC | PRN

## 2010-11-25 NOTE — Telephone Encounter (Signed)
Dr. Jeanice Lim from [facility] is seeing this pt for her back pain. States pt is c/o chest rash x 1 wk & thinks it may be from her new HIV drugs. Asked her to have pt call us for an appt to get that checked. Dr. Asked if NSAIDS were ok for her. I checked with Cleotis Lema & they are ok.

## 2010-11-25 NOTE — Progress Notes (Signed)
  Subjective:    Patient ID: Yolanda Hatfield, female    DOB: [birth year], 30 y.o.   MRN: [id number]  HPI  Back pain for 24 hours, pain located in lumbar region, aching sensation, non radiating, no specific injury, no change in bowel or bladder, she did think this felt similar to her UTI at one time. No dysuria, no vaginal discharge, + constipation   Started on 2 new anti-viral medications 2 weeks ago  Rash- rash on chest started 1 week ago itchy red rash on chest, feels like it may be spreading, no fever, no oral lesions, no SOB, no pain No new  Contacts with soap/lotion/plants etc   Review of Systems per above     Objective:   Physical Exam   GEN- NAD, alert and oriented   HEEN- oropharynx clear, MMM, no oral lesions   NECK- no LAD, normal ROM   Skin- Erythematous maculopaular rash on chest, +excoriations, no pustutules   Back- Spine non tender, TTP paraspinals in lumbar region, no CVA tenderness, neg SLR, motor equal bilat, sensation equal bilat, DTR symmetic bilat, Hip- FROM without pain   ABD- Soft, NT, ND, NABS       Assessment & Plan:   Back pain- neg UA for UTI though low on differntial, likley a strain/ musculoskeletal pain, pt unaware of any precipitating factors. I called ID, she is okay to take NSAIDS, see instructions of movement, heating pad. No red flags on exam today  Rash- unclear cause- may be related to new HIV meds, ID nurse advised me not to give anything orally and have pt make a work-in with ID clinic. I did give topical cortisone for itch and red flags

## 2010-11-25 NOTE — Telephone Encounter (Signed)
C/o low back pain starting yesterday. Pain was 1/10 yesterday & is 6/10 today. Has not tried any otc. Thinks she may have a a bladder or kidney infection. We have no appts today. She is a pt at [facility]. Advised her to call for an appt with her pcp. She agreed

## 2010-11-25 NOTE — Patient Instructions (Signed)
For your back make sure you are moving around, try exercises, use the Anti-inflammatory, try a heating pad For the rash- if you develop fever, or rash worsens then please call ID before your appt.or go to ED  You can use the topical cortisone for your rash Call and make an appt for your rash with ID clinic

## 2010-11-26 ENCOUNTER — Telehealth: Payer: Self-pay | Admitting: *Deleted

## 2010-11-26 NOTE — Telephone Encounter (Signed)
She went over what Dr. Jeanice Lim from [facility] said to her yesterday. The creme is clearing her rash but she thinks it needs to be seen since she is on new HIV meds. Transferred her to front to make an appt

## 2010-11-27 ENCOUNTER — Ambulatory Visit: Payer: Medicaid Other | Admitting: Adult Health

## 2010-11-30 ENCOUNTER — Ambulatory Visit: Payer: Medicaid Other | Admitting: Internal Medicine

## 2010-12-02 IMAGING — US US OB COMP +14 WK
1 series · 14 of 28 positions shown · non-contrast
Comparison: none

OBSTETRICAL ULTRASOUND:
 This ultrasound exam was performed in the [HOSPITAL] Ultrasound Department.  The OB US report was generated in the AS system, and faxed to the ordering physician.  This report is also available in [HOSPITAL]?s Access[employer] and in [REDACTED] PACS.

[Series 1: us ob comp +14 wk · 0.21mm/px · 14 of 83 slices shown]
[im 4/83]
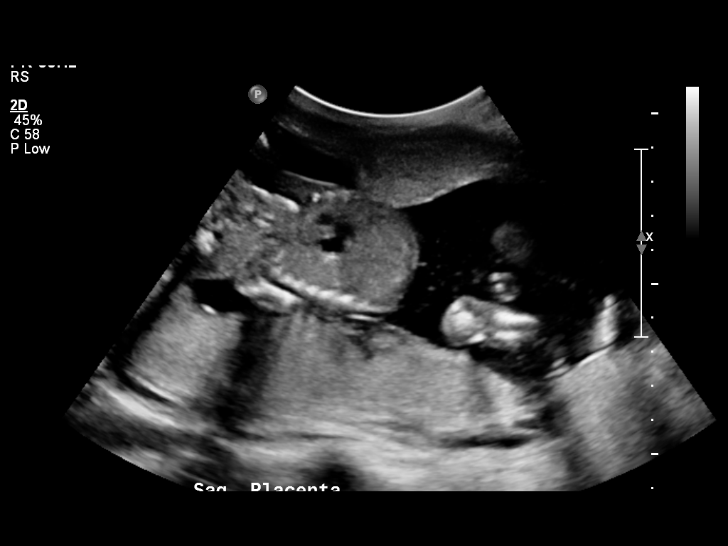
[im 10/83]
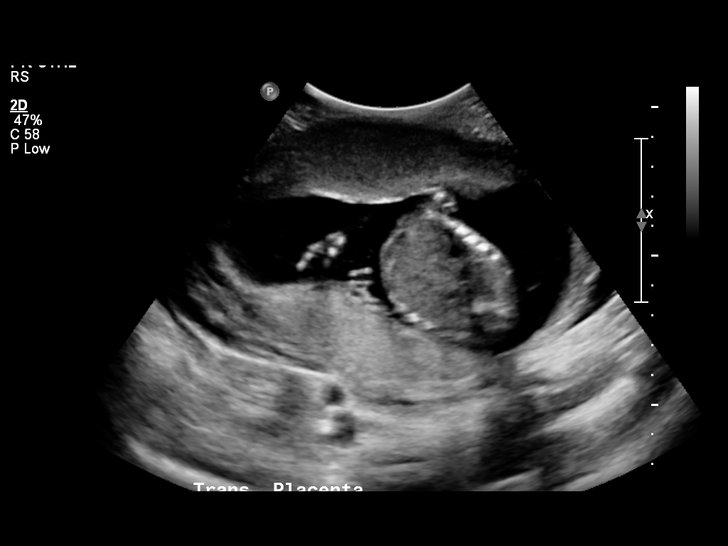
[im 16/83]
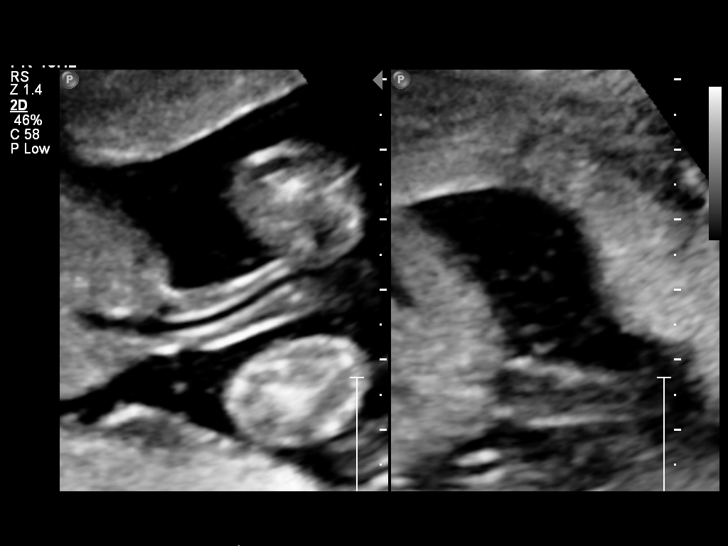
[im 22/83]
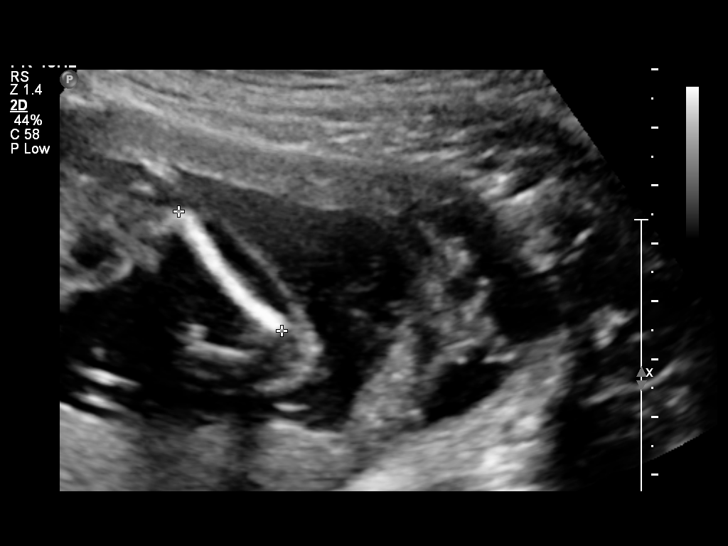
[im 28/83]
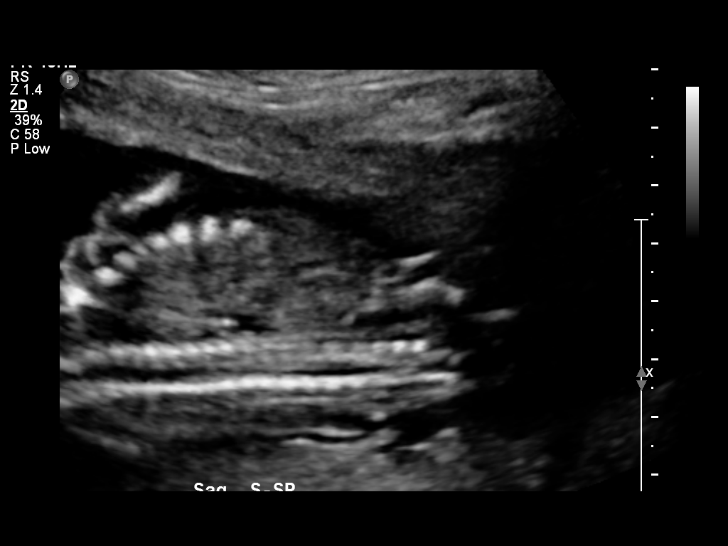
[im 34/83]
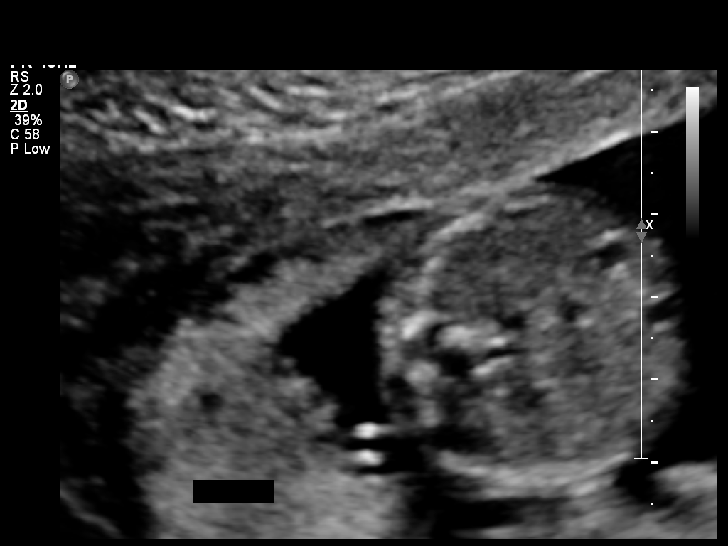
[im 40/83]
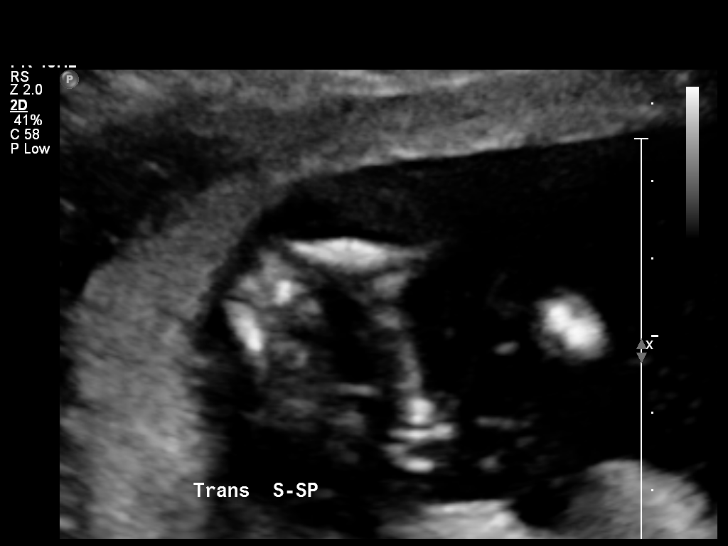
[im 46/83]
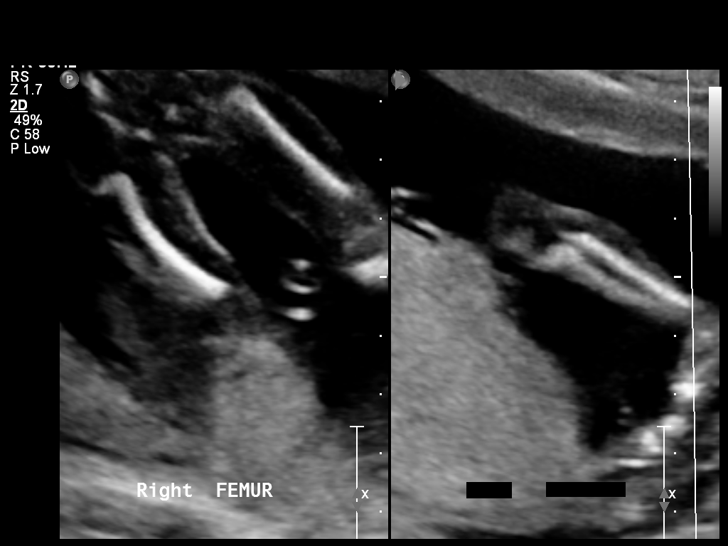
[im 52/83]
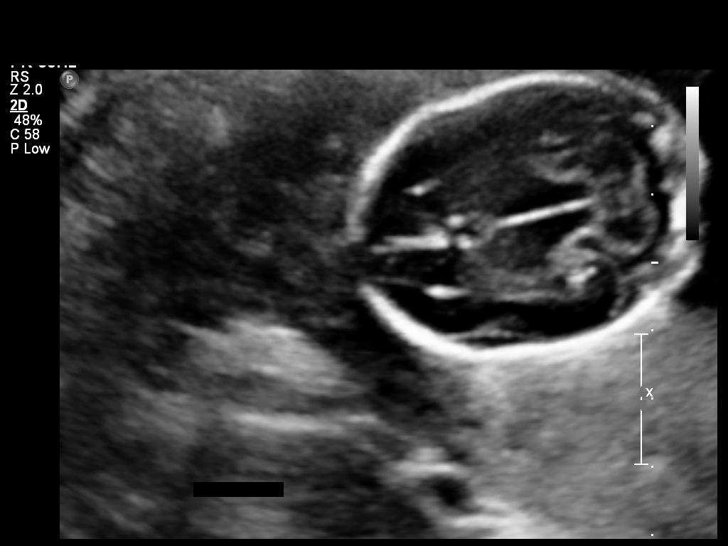
[im 58/83]
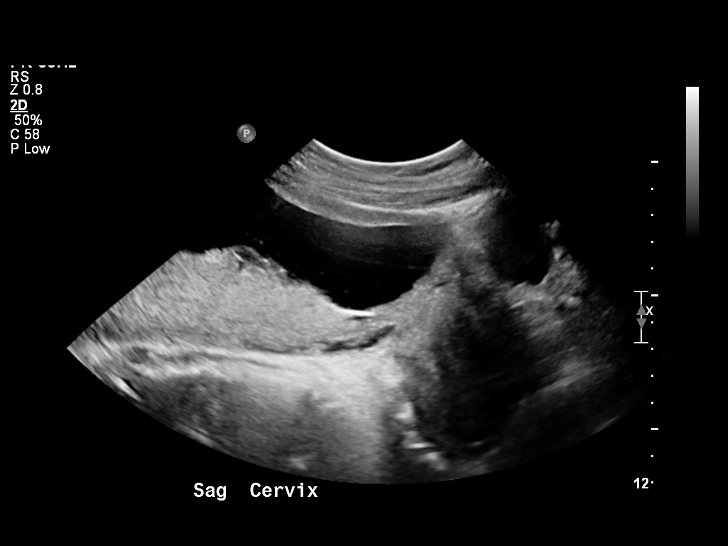
[im 64/83]
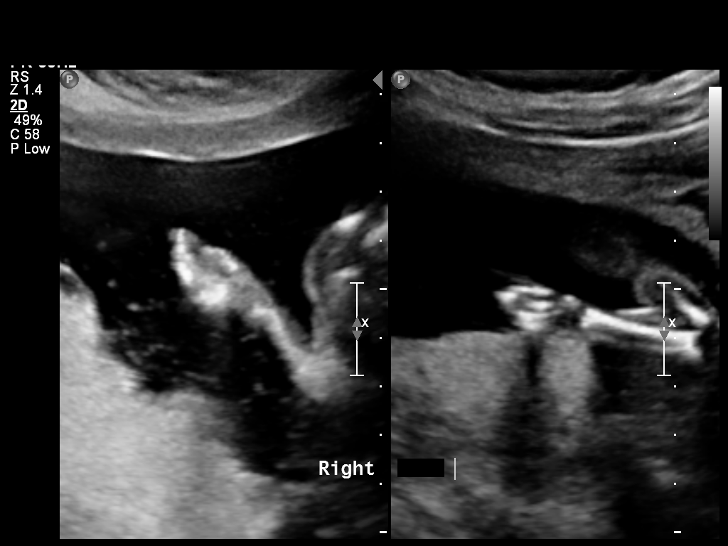
[im 70/83]
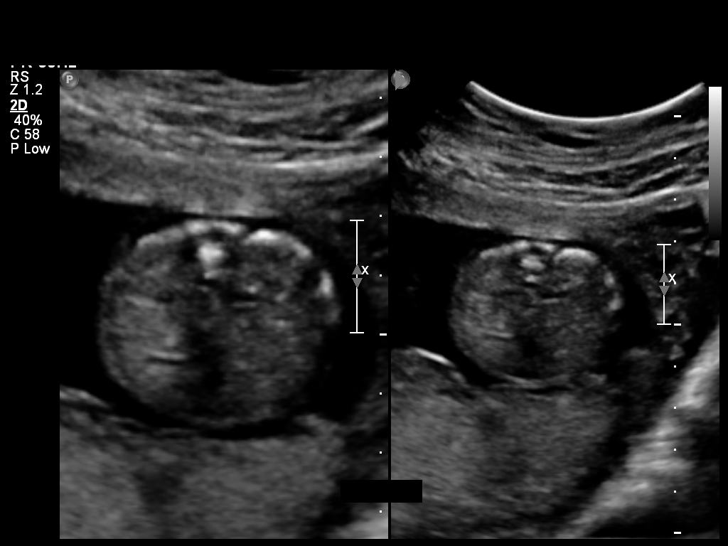
[im 76/83]
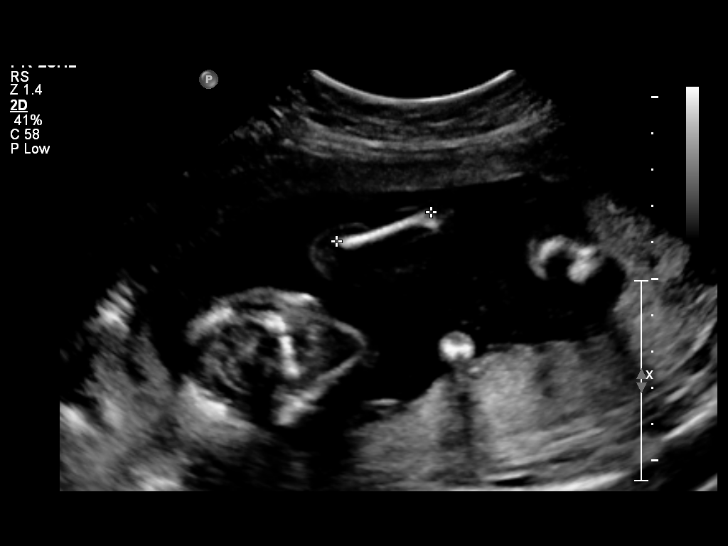
[im 83/83]
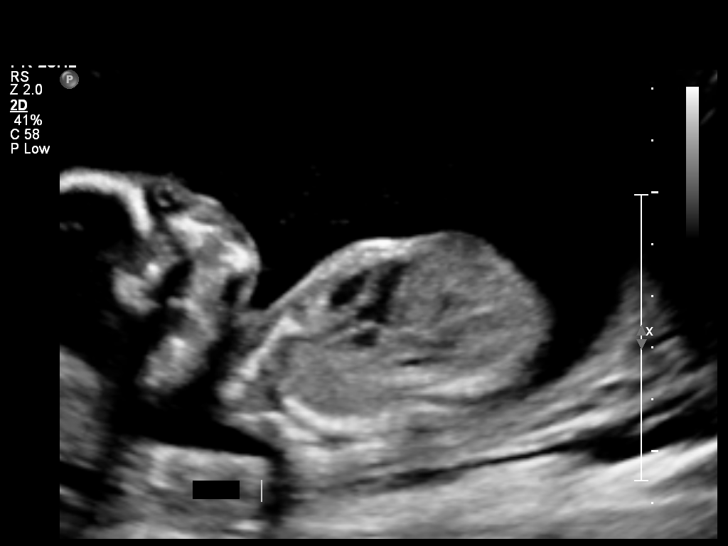

[14 of 28 positions shown; findings below may reference images not displayed]

IMPRESSION: See AS Obstetric US report.

## 2010-12-07 ENCOUNTER — Other Ambulatory Visit: Payer: Self-pay | Admitting: *Deleted

## 2010-12-07 DIAGNOSIS — B20 Human immunodeficiency virus [HIV] disease: Secondary | ICD-10-CM

## 2010-12-07 MED ORDER — FLUCONAZOLE 100 MG PO TABS: 150.0000 mg | ORAL_TABLET | Freq: Once | ORAL | Status: DC

## 2010-12-08 ENCOUNTER — Other Ambulatory Visit: Payer: Medicaid Other

## 2010-12-16 ENCOUNTER — Other Ambulatory Visit: Payer: Self-pay | Admitting: Licensed Clinical Social Worker

## 2010-12-16 DIAGNOSIS — B20 Human immunodeficiency virus [HIV] disease: Secondary | ICD-10-CM

## 2010-12-16 MED ORDER — FLUCONAZOLE 100 MG PO TABS: 150.0000 mg | ORAL_TABLET | Freq: Once | ORAL | Status: DC

## 2010-12-22 ENCOUNTER — Ambulatory Visit: Payer: Medicaid Other | Admitting: Internal Medicine

## 2010-12-25 ENCOUNTER — Inpatient Hospital Stay (INDEPENDENT_AMBULATORY_CARE_PROVIDER_SITE_OTHER)
Admission: RE | Admit: 2010-12-25 | Discharge: 2010-12-25 | Disposition: A | Discharge status: Home / Self Care | Payer: 59 | Source: Ambulatory Visit | Attending: Family Medicine | Admitting: Family Medicine

## 2010-12-25 DIAGNOSIS — IMO0002 Reserved for concepts with insufficient information to code with codable children: Secondary | ICD-10-CM

## 2011-02-18 LAB — T-HELPER CELL (CD4) - (RCID CLINIC ONLY)
CD4 % Helper T Cell: 24 — ABNORMAL LOW
CD4 T Cell Abs: 420

## 2011-02-24 LAB — T-HELPER CELL (CD4) - (RCID CLINIC ONLY)
CD4 % Helper T Cell: 27 — ABNORMAL LOW
CD4 T Cell Abs: 410

## 2011-03-03 LAB — T-HELPER CELL (CD4) - (RCID CLINIC ONLY): CD4 T Cell Abs: 470

## 2011-04-13 ENCOUNTER — Other Ambulatory Visit: Payer: Self-pay | Admitting: Internal Medicine

## 2011-04-13 ENCOUNTER — Other Ambulatory Visit (INDEPENDENT_AMBULATORY_CARE_PROVIDER_SITE_OTHER): Payer: 59

## 2011-04-13 DIAGNOSIS — Z113 Encounter for screening for infections with a predominantly sexual mode of transmission: Secondary | ICD-10-CM

## 2011-04-13 DIAGNOSIS — B20 Human immunodeficiency virus [HIV] disease: Secondary | ICD-10-CM

## 2011-04-14 LAB — CBC WITH DIFFERENTIAL/PLATELET
Eosinophils Absolute: 0.1 10*3/uL (ref 0.0–0.7)
Eosinophils Relative: 3 % (ref 0–5)
Hemoglobin: 12.4 g/dL (ref 12.0–15.0)
Lymphs Abs: 1.4 10*3/uL (ref 0.7–4.0)
MCH: 30.8 pg (ref 26.0–34.0)
MCV: 94.3 fL (ref 78.0–100.0)
Monocytes Relative: 10 % (ref 3–12)
Neutrophils Relative %: 27 % — ABNORMAL LOW (ref 43–77)
RBC: 4.03 MIL/uL (ref 3.87–5.11)

## 2011-04-14 LAB — COMPLETE METABOLIC PANEL WITH GFR
CO2: 24 mEq/L (ref 19–32)
Calcium: 9.4 mg/dL (ref 8.4–10.5)
Chloride: 105 mEq/L (ref 96–112)
GFR, Est African American: >89 mL/min/{1.73_m2}
GFR, Est Non African American: >89 mL/min/{1.73_m2}
Glucose, Bld: 78 mg/dL (ref 70–99)
Sodium: 138 mEq/L (ref 135–145)
Total Bilirubin: 0.4 mg/dL (ref 0.3–1.2)
Total Protein: 7 g/dL (ref 6.0–8.3)

## 2011-04-14 LAB — T-HELPER CELL (CD4) - (RCID CLINIC ONLY): CD4 % Helper T Cell: 36 % (ref 33–55)

## 2011-04-16 LAB — HIV-1 GENOTYPR PLUS

## 2011-04-27 ENCOUNTER — Ambulatory Visit (INDEPENDENT_AMBULATORY_CARE_PROVIDER_SITE_OTHER): Payer: 59 | Admitting: Internal Medicine

## 2011-04-27 ENCOUNTER — Encounter: Payer: Self-pay | Admitting: Internal Medicine

## 2011-04-27 VITALS — BP 145/78 | HR 93 | Temp 98.1°F | Ht 66.0 in | Wt 173.0 lb

## 2011-04-27 DIAGNOSIS — B20 Human immunodeficiency virus [HIV] disease: Secondary | ICD-10-CM

## 2011-04-27 MED ORDER — EMTRICITABINE-TENOFOVIR DF 200-300 MG PO TABS: 1.0000 | ORAL_TABLET | Freq: Every day | ORAL | Status: DC

## 2011-04-27 MED ORDER — RITONAVIR 100 MG PO TABS: 100.0000 mg | ORAL_TABLET | Freq: Every day | ORAL | Status: DC

## 2011-04-27 MED ORDER — ATAZANAVIR SULFATE 300 MG PO CAPS: 300.0000 mg | ORAL_CAPSULE | Freq: Every day | ORAL | Status: DC

## 2011-04-27 NOTE — Progress Notes (Signed)
  Subjective:    Patient ID: Yolanda Hatfield, female    DOB: Oct 15, 1980, 30 y.o.   MRN: [id number]  HPIthis patient comes in for follow 4042. She has been on a regimen of Kaletra and Combivir during pregnancy several years ago and was started earlier this year on boost and Reyataz with Truvada. The patient was taking the medication well however she ran out about 3 weeks ago and wasn't sure she should take keep taking it. In discussion with her is unclear that she really understands the importance of taking it. She is however motivated to take her medications today. She does have difficulty with her work schedule to get in for appointments and finds that as a barrier occasionally. Otherwise she has no complaints and tolerates medicines well and she does take it. She does have some occasional bouts of reflux but otherwise no significant problems.    Review of Systems  Constitutional: Negative for fever, fatigue and unexpected weight change.  HENT: Negative for trouble swallowing.   Respiratory: Negative for cough and shortness of breath.   Cardiovascular: Negative for chest pain.  Gastrointestinal: Negative for nausea, abdominal pain and diarrhea.  Genitourinary: Negative for dysuria, urgency, vaginal discharge, menstrual problem and pelvic pain.  Musculoskeletal: Negative for myalgias and arthralgias.  Skin: Negative for rash.  Neurological: Negative for headaches.  Hematological: Negative for adenopathy.  Psychiatric/Behavioral: Negative for dysphoric mood. The patient is not nervous/anxious.        Objective:   Physical Exam  Constitutional: She is oriented to person, place, and time. She appears well-developed and well-nourished. No distress.  HENT:  Mouth/Throat: Oropharynx is clear and moist. No oropharyngeal exudate.  Cardiovascular: Normal rate, regular rhythm and normal heart sounds.  Exam reveals no gallop and no friction rub.   No murmur heard. Pulmonary/Chest: Effort normal  and breath sounds normal. No respiratory distress. She has no wheezes.  Abdominal: Soft. Bowel sounds are normal. There is no tenderness. There is no rebound.  Lymphadenopathy:    She has no cervical adenopathy.  Neurological: She is alert and oriented to person, place, and time.  Skin: Skin is warm and dry. No rash noted.  Psychiatric: She has a normal mood and affect. Her behavior is normal.          Assessment & Plan:

## 2011-04-27 NOTE — Assessment & Plan Note (Signed)
I had a long discussion with the patient regarding the effectiveness or lack of effectiveness with intermittent therapy. I did emphasize the need to start the medication and continue it daily without any missed doses and the importance to avoid any resistance. I also did discuss other options including Complera however the patient is happy with her current regimen and wants to restart this. In addition she tells me that she was taking the boost a grade test but without Truvada. She is unsure of why that prescription of Truvada was not given to her. I did also discuss the need for condom use with all sexual activity. She will return in one month for labs and if she has any resistance at the time this will be discussed with her otherwise she will follow up in January. As discussed if she has any problems following up her making an appointment that she can call and we can discuss over the phone and he issues. She also is given co-pay cards to help with the medications.

## 2011-04-27 NOTE — Patient Instructions (Addendum)
Labs in 1 months (prior to the new year)  Take 1 Reyataz pill (300mg ), 1 Truvada pill and 1 Norvir pill daily

## 2011-04-30 ENCOUNTER — Telehealth: Payer: Self-pay | Admitting: Licensed Clinical Social Worker

## 2011-04-30 ENCOUNTER — Other Ambulatory Visit: Payer: Self-pay | Admitting: Internal Medicine

## 2011-04-30 MED ORDER — IBUPROFEN 800 MG PO TABS: 800.0000 mg | ORAL_TABLET | Freq: Three times a day (TID) | ORAL | Status: AC | PRN

## 2011-04-30 MED ORDER — OSELTAMIVIR PHOSPHATE 75 MG PO CAPS: 75.0000 mg | ORAL_CAPSULE | Freq: Two times a day (BID) | ORAL | Status: AC

## 2011-04-30 NOTE — Telephone Encounter (Signed)
Patient called stating that she received a flu shot on Tuesday and Wednesday started having fever, chills, cough, congestion, unable to get out of bed without feeling exhausted. Patient wanted a rx for ibuprofen 800 so her flexible spending account would pay for it, and if possible tamiflu. Please advise

## 2011-04-30 NOTE — Telephone Encounter (Signed)
Ok, it has been sent to her pharmacy

## 2011-05-03 ENCOUNTER — Encounter: Payer: Self-pay | Admitting: Licensed Clinical Social Worker

## 2011-05-03 ENCOUNTER — Other Ambulatory Visit: Payer: Self-pay | Admitting: *Deleted

## 2011-05-03 NOTE — Telephone Encounter (Signed)
Gave her the note for work . Told her we have left a message for the Norvir rep asking for co-pay cards. Pt states she will pay for it without the card

## 2011-05-04 ENCOUNTER — Ambulatory Visit
Admission: RE | Admit: 2011-05-04 | Discharge: 2011-05-04 | Disposition: A | Discharge status: Home / Self Care | Payer: 59 | Source: Ambulatory Visit | Attending: Internal Medicine | Admitting: Internal Medicine

## 2011-05-04 ENCOUNTER — Encounter: Payer: Self-pay | Admitting: Internal Medicine

## 2011-05-04 ENCOUNTER — Ambulatory Visit (INDEPENDENT_AMBULATORY_CARE_PROVIDER_SITE_OTHER): Payer: Medicaid Other | Admitting: Internal Medicine

## 2011-05-04 ENCOUNTER — Other Ambulatory Visit: Payer: Self-pay | Admitting: Internal Medicine

## 2011-05-04 VITALS — BP 106/69 | HR 76 | Temp 98.6°F | Wt 165.0 lb

## 2011-05-04 DIAGNOSIS — R05 Cough: Secondary | ICD-10-CM

## 2011-05-04 DIAGNOSIS — B20 Human immunodeficiency virus [HIV] disease: Secondary | ICD-10-CM

## 2011-05-04 MED ORDER — GUAIFENESIN-DM 100-10 MG/5ML PO SYRP: 5.0000 mL | ORAL_SOLUTION | Freq: Three times a day (TID) | ORAL | Status: AC | PRN

## 2011-05-05 LAB — CBC WITH DIFFERENTIAL/PLATELET
HCT: 37.8 % (ref 36.0–46.0)
Hemoglobin: 12.9 g/dL (ref 12.0–15.0)
Lymphocytes Relative: 64 % — ABNORMAL HIGH (ref 12–46)
Lymphs Abs: 2 10*3/uL (ref 0.7–4.0)
MCHC: 34.1 g/dL (ref 30.0–36.0)
Monocytes Absolute: 0.3 10*3/uL (ref 0.1–1.0)
Monocytes Relative: 9 % (ref 3–12)
Neutro Abs: 0.7 10*3/uL — ABNORMAL LOW (ref 1.7–7.7)
Neutrophils Relative %: 23 % — ABNORMAL LOW (ref 43–77)
RBC: 4.1 MIL/uL (ref 3.87–5.11)
WBC: 3.2 10*3/uL — ABNORMAL LOW (ref 4.0–10.5)

## 2011-05-05 LAB — PATHOLOGIST SMEAR REVIEW

## 2011-05-05 NOTE — Progress Notes (Signed)
Infectious Diseases Clinic- Sick Visit  Reason for Visit:  Influenza like Illness: Productive cough, feverish x 7 days   HPI: Yolanda Hatfield is a 30 y.o. female with HIV, CD4 count 480 (36%)/ VL 9,340 on truvada/boosted atazanavir, last seen in ID clinic on 11/27, at that time it was determined that the patient was not taking ritonavir, somehow rx fell off from her routine medications. Since this discrepancy has been resolved, she reports taking her medications as directed, 3 drugs, daily. She states that she has had a productive cough and initially had fever of up to 102F. She has been afebrile for the past few days. She called into the clinic a few days ago due to her symptoms for which she was prescribed ibuprofen 800mg  Q8hr prn and oseltamivir. She is only taking ibuprofen and not antiviral. She states her symptoms had somewhat improved with NSAIDs and use of mucinex. She presents to clinic concerned that her symptoms are still present, but overall on the decline. She states that she has nasal congestion, ear fullness that is improving. No wheezing, no dyspnea on exertion, but productive cough with yellowish sputum. She thinks she has given her cold to her 6 yr old daughter who is now sick with a cough, but no fever. This is the first time that the patient will be missing work due to illness. She works at [employer]. Her mother will be caring for her daughter over the next few days so that Yolanda Hatfield can rest.  Past Medical History  Diagnosis Date  . HIV infection     Allergies:  Allergies  Allergen Reactions  . Cephalexin   . Codeine Phosphate Hives  . Sulfonamide Derivatives     Current antibiotics: none   MEDICATIONS: truvada daily atazanavir 300mg  daily Ritonavir 100mg  daily Ibuprofen 800mg  Q8 prn     History  Substance Use Topics  . Smoking status: Never Smoker   . Smokeless tobacco: Never Used  . Alcohol Use: No    Family History  Problem Relation Age of Onset  . Diabetes  Mother   . Stroke Mother     Review of Systems - Negative except what is mentioned in the HPI, productive cough, no nightsweats/chills/fevers. No arthralgia/myalgias. Although she has fatigue  OBJECTIVE: BP 106/69  Pulse 76  Temp(Src) 98.6 F (37 C) (Oral)  Wt 74.844 kg (165 lb)  General Appearance:    Alert, cooperative, no distress, appears stated age  Head:    Normocephalic, without obvious abnormality, atraumatic  Eyes:    PERRL, mild injectedconjunctiva/corneas clear, EOMI  Ears:    Normal TM's and external ear canals, both ears  Nose:   Nares normal, septum midline, mucosa normal, no drainage    or sinus tenderness  Throat:   Lips, mucosa, and tongue normal; posterior oral pharynx slightly erythamatous  Neck:   Supple, symmetrical, trachea midline, no adenopathy;    thyroid:  no enlargement/tenderness/nodules;      Lungs:     Clear to auscultation bilaterally, respirations unlabored, no w/c/r. Hoarse cough      Heart:    Regular rate and rhythm, S1 and S2 normal, no murmur, rub   or gallop  Breast Exam:    No tenderness, masses, or nipple abnormality  Abdomen:     Soft, non-tender, bowel sounds active all four quadrants,    no masses, no organomegaly  Left buttocks:    Small area of induration, non-fluctuant, raised, not tender      Extremities:   Extremities  normal, atraumatic, no cyanosis or edema  Pulses:   2+ and symmetric all extremities  Skin:   Skin color, texture, turgor normal, no rashes, lesion on left buttock as described above  Lymph nodes:   Cervical, supraclavicular, and axillary nodes normal       LABS: Results for orders placed in visit on 05/04/11 (from the past 48 hour(s))  CBC WITH DIFFERENTIAL     Status: Abnormal   Collection Time   05/04/11  5:01 PM      Component Value Range Comment   WBC 3.2 (*) 4.0 - 10.5 (K/uL)    RBC 4.10  3.87 - 5.11 (MIL/uL)    Hemoglobin 12.9  12.0 - 15.0 (g/dL)    HCT 16.1  09.6 - 04.5 (%)    MCV 92.2  78.0 - 100.0  (fL)    MCH 31.5  26.0 - 34.0 (pg)    MCHC 34.1  30.0 - 36.0 (g/dL)    RDW 40.9  81.1 - 91.4 (%)    Platelets 242  150 - 400 (K/uL)    Neutrophils Relative 23 (*) 43 - 77 (%)    Neutro Abs 0.7 (*) 1.7 - 7.7 (K/uL)    Lymphocytes Relative 64 (*) 12 - 46 (%)    Lymphs Abs 2.0  0.7 - 4.0 (K/uL)    Monocytes Relative 9  3 - 12 (%)    Monocytes Absolute 0.3  0.1 - 1.0 (K/uL)    Eosinophils Relative 4  0 - 5 (%)    Eosinophils Absolute 0.1  0.0 - 0.7 (K/uL)    Basophils Relative 1  0 - 1 (%)    Basophils Absolute 0.0  0.0 - 0.1 (K/uL)    Smear Review       PATHOLOGIST SMEAR REVIEW     Status: Normal (Preliminary result)   Collection Time   05/04/11  5:01 PM      Component Value Range Comment   Tech Review         IMAGING: Dg Chest 2 View  05/04/2011  *RADIOLOGY REPORT*  Clinical Data: Cough  CHEST - 2 VIEW  Comparison: Chest x-ray of 01/24/2010  Findings: No active infiltrate or effusion is seen.  Mediastinal contours appear stable.  The heart is within normal limits in size. No bony abnormality is seen near  IMPRESSION: Stable chest x-ray.  No active lung disease.  Original Report Authenticated By: Juline Patch, M.D.    Assessment/Plan:  30yo F with HIV, CD 4count of 480 with detectable vl of 9340 on truvada/boosted atazanavir presents with respiratory viral infection for the last 8 days, improving but still has productive cough.  DDX influenza, viral infection with secondary bacterial infection.  1) will check CBC, sputum culture and CXR. If cbc is significantly elevated from baseline + CXR findings then will treat for CAP. Her presentations seems more consistent with viral infection.  Addendum = cxr is without infiltrate. Will not treat for 2nd bacterial infection   2) symptomatic relief, can try robutussin DM.  3) patient has had symptoms now for 8 days, unlikely to have benefit for using oseltamavir. Will ask not to fill since she is reporting clinical improvement  4) if her  symptoms persist and not improved, will consider testing for pertussis. Think it's unlikely for the moment since she was sick initially and did not have sick contact of her child. Has had her immunizations utd.   5) hiv- continue with current ART regimen

## 2011-05-06 ENCOUNTER — Encounter: Payer: Self-pay | Admitting: *Deleted

## 2011-05-07 LAB — RESPIRATORY CULTURE OR RESPIRATORY AND SPUTUM CULTURE

## 2011-05-13 ENCOUNTER — Telehealth: Payer: Self-pay | Admitting: *Deleted

## 2011-05-13 NOTE — Telephone Encounter (Signed)
Patient called to check on the status of the paperwork she faxed to the office on 05/12/11. Advised her that it is not completed at this time. She said she needs it faxed back to her employer by end of the day 05/14/11 if at all possible. Advised her will try to have the provider fill it out and have it back to them asap but that the provider is out of the office today and should be back tomorrow. But we will do our best to have it done for her.

## 2011-07-09 ENCOUNTER — Telehealth: Payer: Self-pay | Admitting: *Deleted

## 2011-07-09 NOTE — Telephone Encounter (Signed)
She had left [place] a message on the machine asking [place] to call her. I called back & got a loud buzzing sound. The call never went thru. Will wait for her to call back

## 2011-07-12 ENCOUNTER — Telehealth: Payer: Self-pay | Admitting: *Deleted

## 2011-07-12 ENCOUNTER — Emergency Department (INDEPENDENT_AMBULATORY_CARE_PROVIDER_SITE_OTHER)
Admission: EM | Admit: 2011-07-12 | Discharge: 2011-07-12 | Disposition: A | Discharge status: Home / Self Care | Payer: 59 | Source: Home / Self Care | Attending: Family Medicine | Admitting: Family Medicine

## 2011-07-12 ENCOUNTER — Encounter (HOSPITAL_COMMUNITY): Payer: Self-pay | Admitting: *Deleted

## 2011-07-12 DIAGNOSIS — H6691 Otitis media, unspecified, right ear: Secondary | ICD-10-CM

## 2011-07-12 DIAGNOSIS — H6690 Otitis media, unspecified, unspecified ear: Secondary | ICD-10-CM

## 2011-07-12 MED ORDER — ANTIPYRINE-BENZOCAINE 5.4-1.4 % OT SOLN
3.0000 [drp] | Freq: Once | OTIC | Status: AC
  Administered 2011-07-12: 3 [drp] via OTIC

## 2011-07-12 MED ORDER — TRAMADOL HCL 50 MG PO TABS
50.0000 mg | ORAL_TABLET | Freq: Four times a day (QID) | ORAL | Status: AC | PRN
Start: 2011-07-12 — End: 2011-07-22

## 2011-07-12 MED ORDER — IBUPROFEN 800 MG PO TABS: 800.0000 mg | ORAL_TABLET | Freq: Once | ORAL | Status: DC

## 2011-07-12 MED ORDER — CLINDAMYCIN HCL 150 MG PO CAPS: 150.0000 mg | ORAL_CAPSULE | Freq: Four times a day (QID) | ORAL | Status: AC

## 2011-07-12 MED ORDER — GUAIFENESIN ER 600 MG PO TB12: 1200.0000 mg | ORAL_TABLET | Freq: Two times a day (BID) | ORAL | Status: DC

## 2011-07-12 MED ORDER — IBUPROFEN 800 MG PO TABS
ORAL_TABLET | ORAL | Status: AC
  Filled 2011-07-12: qty 1

## 2011-07-12 NOTE — ED Provider Notes (Signed)
History     CSN: [id number]  Arrival date & time 07/12/11  1356   First MD Initiated Contact with Patient 07/12/11 1514      Chief Complaint  Patient presents with  . Otalgia    (Consider location/radiation/quality/duration/timing/severity/associated sxs/prior treatment) Patient is a 31 y.o. female presenting with ear pain. The history is provided by the patient.  Otalgia This is a new problem. The current episode started 6 to 12 hours ago (has had uri sx for 1 week with ear pain this am.). There is pain in the right ear. The problem occurs constantly. The problem has been gradually worsening. There has been no fever. The pain is moderate. Associated symptoms include rhinorrhea. Pertinent negatives include no ear discharge, no diarrhea and no vomiting.    Past Medical History  Diagnosis Date  . HIV infection     History reviewed. No pertinent past surgical history.  Family History  Problem Relation Age of Onset  . Diabetes Mother   . Stroke Mother     History  Substance Use Topics  . Smoking status: Current Everyday Smoker  . Smokeless tobacco: Never Used  . Alcohol Use: No    OB History    Grav Para Term Preterm Abortions TAB SAB Ect Mult Living                  Review of Systems  Constitutional: Negative.   HENT: Positive for ear pain, congestion, rhinorrhea and postnasal drip. Negative for ear discharge.   Eyes: Negative.   Respiratory: Negative.   Cardiovascular: Negative.   Gastrointestinal: Negative.  Negative for vomiting and diarrhea.    Allergies  Cephalexin; Codeine phosphate; and Sulfonamide derivatives  Home Medications   Current Outpatient Rx  Name Route Sig Dispense Refill  . ATAZANAVIR SULFATE 300 MG PO CAPS Oral Take 1 capsule (300 mg total) by mouth daily with breakfast. 30 capsule 11  . EMTRICITABINE-TENOFOVIR 200-300 MG PO TABS Oral Take 1 tablet by mouth daily. 30 tablet 11  . RITONAVIR 100 MG PO TABS Oral Take 1 tablet (100 mg  total) by mouth daily with breakfast. 30 tablet 11  . CLINDAMYCIN HCL 150 MG PO CAPS Oral Take 1 capsule (150 mg total) by mouth every 6 (six) hours. 28 capsule 0  . FLUCONAZOLE 100 MG PO TABS Oral Take 1.5 tablets (150 mg total) by mouth once. 1 tablet 0  . GUAIFENESIN ER 600 MG PO TB12 Oral Take 2 tablets (1,200 mg total) by mouth 2 (two) times daily. 30 tablet 1  . TRAMADOL HCL 50 MG PO TABS Oral Take 1 tablet (50 mg total) by mouth every 6 (six) hours as needed for pain. 15 tablet 0    BP 134/73  Pulse 64  Temp(Src) 98.5 F (36.9 C) (Oral)  Resp 20  SpO2 100%  Physical Exam  Nursing note and vitals reviewed. Constitutional: She is oriented to person, place, and time. She appears well-developed and well-nourished. She appears distressed.  HENT:  Head: Normocephalic.  Right Ear: Ear canal normal. There is tenderness. Tympanic membrane is injected, erythematous and bulging. A middle ear effusion is present.  Left Ear: Tympanic membrane, external ear and ear canal normal.  Mouth/Throat: Oropharynx is clear and moist.  Eyes: Conjunctivae are normal. Pupils are equal, round, and reactive to light.  Neck: Normal range of motion. Neck supple.  Lymphadenopathy:    She has no cervical adenopathy.  Neurological: She is alert and oriented to person, place, and time.  Skin: Skin is warm and dry.    ED Course  Procedures (including critical care time)  Labs Reviewed - No data to display No results found.   1. Otitis media of right ear       MDM          Barkley Bruns, MD 07/12/11 [phone]

## 2011-07-12 NOTE — ED Notes (Signed)
Pt states she's had sinus/nasal congestion for a week.  Today her right ear started hurting.  Pt very demonstrative, tapping her feet, shaking her hands, crying.  Not wanting to answer questions.  Explained to her that we can only help her if she tells [place] what is going on.  States pain is a stabbing pain, feels like her face is swollen on right side, hurts to blow her nose ( clear nasal drainage).  States she took a "sinus pill" from a friend today.

## 2011-07-12 NOTE — Telephone Encounter (Signed)
Patient calls our office from an [facility] at [facility] stating she wants to go to the ED because she feels she is not being helped.  States they have put some drops in her ears . Her head and face are hurting. She wants [place] to call the ED to tell them she is coming over there. Advised patient that she needs to stay at [facility]gent Care and talk to her  nurse there.

## 2011-07-13 ENCOUNTER — Ambulatory Visit: Payer: 59 | Admitting: Family Medicine

## 2011-07-14 ENCOUNTER — Other Ambulatory Visit: Payer: Self-pay | Admitting: *Deleted

## 2011-07-14 DIAGNOSIS — B20 Human immunodeficiency virus [HIV] disease: Secondary | ICD-10-CM

## 2011-07-14 MED ORDER — RITONAVIR 100 MG PO TABS: 100.0000 mg | ORAL_TABLET | Freq: Every day | ORAL | Status: DC

## 2011-07-14 MED ORDER — ATAZANAVIR SULFATE 300 MG PO CAPS: 300.0000 mg | ORAL_CAPSULE | Freq: Every day | ORAL | Status: AC

## 2011-07-14 MED ORDER — EMTRICITABINE-TENOFOVIR DF 200-300 MG PO TABS: 1.0000 | ORAL_TABLET | Freq: Every day | ORAL | Status: AC

## 2011-08-16 ENCOUNTER — Other Ambulatory Visit: Payer: 59

## 2011-08-17 ENCOUNTER — Other Ambulatory Visit: Payer: Self-pay | Admitting: Internal Medicine

## 2011-08-17 DIAGNOSIS — B20 Human immunodeficiency virus [HIV] disease: Secondary | ICD-10-CM

## 2011-08-17 DIAGNOSIS — Z79899 Other long term (current) drug therapy: Secondary | ICD-10-CM

## 2011-08-20 ENCOUNTER — Other Ambulatory Visit: Payer: 59

## 2011-08-23 ENCOUNTER — Other Ambulatory Visit: Payer: 59

## 2011-08-23 DIAGNOSIS — B20 Human immunodeficiency virus [HIV] disease: Secondary | ICD-10-CM

## 2011-08-23 DIAGNOSIS — Z79899 Other long term (current) drug therapy: Secondary | ICD-10-CM

## 2011-08-23 LAB — CBC WITH DIFFERENTIAL/PLATELET
Basophils Relative: 1 % (ref 0–1)
Eosinophils Absolute: 0.1 10*3/uL (ref 0.0–0.7)
Eosinophils Relative: 4 % (ref 0–5)
Lymphs Abs: 1.6 10*3/uL (ref 0.7–4.0)
MCH: 30.7 pg (ref 26.0–34.0)
MCHC: 32.1 g/dL (ref 30.0–36.0)
MCV: 95.6 fL (ref 78.0–100.0)
Neutrophils Relative %: 43 % (ref 43–77)
Platelets: 277 10*3/uL (ref 150–400)

## 2011-08-23 LAB — COMPREHENSIVE METABOLIC PANEL
AST: 14 U/L (ref 0–37)
Albumin: 4.2 g/dL (ref 3.5–5.2)
BUN: 16 mg/dL (ref 6–23)
Calcium: 9.3 mg/dL (ref 8.4–10.5)
Chloride: 107 mEq/L (ref 96–112)
Creat: 0.86 mg/dL (ref 0.50–1.10)
Glucose, Bld: 108 mg/dL — ABNORMAL HIGH (ref 70–99)
Potassium: 4.2 mEq/L (ref 3.5–5.3)

## 2011-08-23 LAB — LIPID PANEL
HDL: 35 mg/dL — ABNORMAL LOW (ref >39)
Total CHOL/HDL Ratio: 5.7 Ratio
Triglycerides: 98 mg/dL (ref <150)

## 2011-08-24 LAB — HIV-1 RNA ULTRAQUANT REFLEX TO GENTYP+: HIV 1 RNA Quant: 80 copies/mL — ABNORMAL HIGH (ref <20)

## 2011-08-25 ENCOUNTER — Telehealth: Payer: Self-pay | Admitting: *Deleted

## 2011-08-25 NOTE — Telephone Encounter (Signed)
Patient called and advised she has not gotten a call that her FMLA paperwork is done. She said she dropped it off last week. Advised that her provider will be in the office on tomorrow, but she said that is to late. 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 Asked Dr Ninetta Lights if he would fill out the paperwork due to the time restraints and he said ok. Advised the patient to come by after lunch today.

## 2011-08-30 ENCOUNTER — Encounter: Payer: Self-pay | Admitting: Internal Medicine

## 2011-08-30 ENCOUNTER — Ambulatory Visit (INDEPENDENT_AMBULATORY_CARE_PROVIDER_SITE_OTHER): Payer: 59 | Admitting: Internal Medicine

## 2011-08-30 VITALS — BP 115/78 | HR 108 | Temp 98.3°F | Ht 66.0 in | Wt 171.0 lb

## 2011-08-30 DIAGNOSIS — B20 Human immunodeficiency virus [HIV] disease: Secondary | ICD-10-CM

## 2011-08-31 NOTE — Assessment & Plan Note (Addendum)
Despite her concerns, she is doing well on her regimen.  She will get an appointment with mental health and I encouraged her to continue with her medications.  I reminded her to use condoms with all sexual activity.   She will need pneumovax next visit.

## 2011-08-31 NOTE — Progress Notes (Signed)
  Subjective:    Patient ID: Yolanda Hatfield, female    DOB: Jan 06, 1981, 31 y.o.   MRN: [id number]  HPI here for follow up of HIV.  Has been sporadically in care and complains of fatigue and symptoms of depression.  After her last visit in November 2012 though, she has been taking her ARVs well and in fact is nearly undetectable with a CD4 of 500.  She is pleased with the news.  She does relate difficulty with getting of from work and has given me an FLA form.  No recent hospitalizations or other issues.      Review of Systems  Constitutional: Negative for fever, chills, fatigue and unexpected weight change.  HENT: Negative for sore throat and trouble swallowing.   Respiratory: Negative for cough, chest tightness and shortness of breath.   Cardiovascular: Negative for chest pain, palpitations and leg swelling.  Gastrointestinal: Negative for nausea, abdominal pain and diarrhea.  Genitourinary: Negative for vaginal discharge, genital sores, menstrual problem and pelvic pain.  Musculoskeletal: Negative for myalgias, joint swelling and arthralgias.  Skin: Negative for rash.  Neurological: Negative for dizziness, weakness and headaches.  Hematological: Negative for adenopathy.  Psychiatric/Behavioral: Positive for dysphoric mood. Negative for sleep disturbance. The patient is not nervous/anxious.        Objective:   Physical Exam  Constitutional: She appears well-developed and well-nourished. No distress.  HENT:  Mouth/Throat: Oropharynx is clear and moist. No oropharyngeal exudate.  Cardiovascular: Normal rate, regular rhythm and normal heart sounds.  Exam reveals no gallop and no friction rub.   No murmur heard. Pulmonary/Chest: Effort normal and breath sounds normal. No respiratory distress. She has no wheezes. She has no rales.  Abdominal: Soft. Bowel sounds are normal. She exhibits no distension. There is no tenderness. There is no rebound.  Lymphadenopathy:    She has no cervical  adenopathy.  Skin: Skin is warm and dry. No rash noted. No erythema.  Psychiatric: She has a normal mood and affect. Her behavior is normal.          Assessment & Plan:

## 2011-09-03 ENCOUNTER — Telehealth: Payer: Self-pay | Admitting: *Deleted

## 2011-09-03 NOTE — Telephone Encounter (Signed)
Called patient to advise her that her FMLA paperwork has been completed and sent back to the company.

## 2011-09-09 ENCOUNTER — Encounter: Payer: Self-pay | Admitting: Family Medicine

## 2011-09-09 ENCOUNTER — Ambulatory Visit (INDEPENDENT_AMBULATORY_CARE_PROVIDER_SITE_OTHER): Payer: 59 | Admitting: Family Medicine

## 2011-09-09 VITALS — BP 113/77 | HR 80 | Ht 66.0 in | Wt 177.0 lb

## 2011-09-09 DIAGNOSIS — R21 Rash and other nonspecific skin eruption: Secondary | ICD-10-CM

## 2011-09-09 MED ORDER — IMIQUIMOD 5 % EX CREA: TOPICAL_CREAM | CUTANEOUS | Status: DC

## 2011-09-09 NOTE — Assessment & Plan Note (Signed)
Rash appears to mollsucum, given umbilicated appearance.  ?Koebner reaction given recent skin trauma.  Will treat with imiquimod to see if this improves area.  Advised to return if not improving.    Precepted with Dr. Perley Jain

## 2011-09-09 NOTE — Patient Instructions (Signed)
Thank you for coming in today, it was nice to meet you. I am sending over a prescription for a cream to see if this helps those areas resolve, quicker If you notice the area worsening, draining pus, becomes painful return to our clinic You may notice some reddening with using the skin cream.
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
## 2011-09-13 ENCOUNTER — Other Ambulatory Visit: Payer: Self-pay | Admitting: Family Medicine

## 2011-09-14 ENCOUNTER — Telehealth: Payer: Self-pay | Admitting: *Deleted

## 2011-09-14 NOTE — Telephone Encounter (Signed)
Requested pt to call [facility] to reschedule PAP smear appt.

## 2011-09-24 ENCOUNTER — Ambulatory Visit: Payer: 59

## 2011-09-24 ENCOUNTER — Ambulatory Visit (INDEPENDENT_AMBULATORY_CARE_PROVIDER_SITE_OTHER): Payer: 59 | Admitting: *Deleted

## 2011-09-24 DIAGNOSIS — Z124 Encounter for screening for malignant neoplasm of cervix: Secondary | ICD-10-CM

## 2011-09-24 NOTE — Progress Notes (Signed)
  Subjective:     Yolanda Hatfield is a 32 y.o. woman who comes in today for a  pap smear only. Contraception: Depo.  Objective:    There were no vitals taken for this visit. Pelvic Exam: Pap smear obtained.   Assessment:    Screening pap smear.   Plan:    Follow up in one year, or as indicated by Pap results.  Provided educational materials re:  HIV and women, BSE, nutrition, diet, exercise, PAP smear, heart health and self-esteem.

## 2011-09-24 NOTE — Patient Instructions (Signed)
  Your results will be ready in about a week.  I will mail them to you.  Thank you for coming to the Center for your care.  Navika Hoopes 

## 2011-09-29 ENCOUNTER — Encounter: Payer: Self-pay | Admitting: *Deleted

## 2011-10-04 ENCOUNTER — Ambulatory Visit (INDEPENDENT_AMBULATORY_CARE_PROVIDER_SITE_OTHER): Payer: 59 | Admitting: Family Medicine

## 2011-10-04 ENCOUNTER — Encounter: Payer: Self-pay | Admitting: Family Medicine

## 2011-10-04 VITALS — BP 126/78 | HR 92 | Temp 98.8°F | Ht 66.0 in | Wt 172.0 lb

## 2011-10-04 DIAGNOSIS — R0600 Dyspnea, unspecified: Secondary | ICD-10-CM | POA: Insufficient documentation

## 2011-10-04 DIAGNOSIS — R209 Unspecified disturbances of skin sensation: Secondary | ICD-10-CM

## 2011-10-04 DIAGNOSIS — R200 Anesthesia of skin: Secondary | ICD-10-CM

## 2011-10-04 DIAGNOSIS — R0989 Other specified symptoms and signs involving the circulatory and respiratory systems: Secondary | ICD-10-CM

## 2011-10-04 DIAGNOSIS — M79643 Pain in unspecified hand: Secondary | ICD-10-CM | POA: Insufficient documentation

## 2011-10-04 LAB — TSH: TSH: 0.803 u[IU]/mL (ref 0.350–4.500)

## 2011-10-04 NOTE — Assessment & Plan Note (Signed)
R hand associated with vague unable to close feeling.  Normal exam.  ?  Carpal tunnel vs overuse.  No evident neurological or vascular compromise.  Check TSH and follow

## 2011-10-04 NOTE — Progress Notes (Signed)
  Subjective:    Patient ID: Yolanda Hatfield, female    DOB: 15-Sep-1980, 31 y.o.   MRN: [id number]  HPI  Hand numbness and fullness Complains of a feeling of not being able to fully close R hand and of vague decreased sensation.  This has been for about 1-2 weeks nonprogressive.  Does not recall any injury or overuse but has been typing a lot a work.  No similar symptoms in left hand or feet.   No rash or pain or weakness or loss of dexterity.  Does feel that sometimes becomes numb at night.     Dyspnea Vague feeling of shortness of breath for last few days to weeks - sensation of not being able to talk as long before having to breath.  No chest pain or edema or wheezing or cough or sputum.  Had similar feeling several years ago wihtout diagnosis.  Has not noticed any decrse in exercise tolerance but does not exercise  Review of Symptoms - see HPI  PMH - Smoking status noted.  HIV status noted    Review of Systems     Objective:   Physical Exam  no apparent distress Heart - Regular rate and rhythm.  No murmurs, gallops or rubs.    Lungs:  Normal respiratory effort, chest expands symmetrically. Lungs are clear to auscultation, no crackles or wheezes. Extremities:  No cyanosis, edema, or deformity noted with good range of motion of all major joints.   R hand - FROM without any observable lack of flexion or extension.  R hand does not appear swollen compared to left.  Full strenght in grip and extension.  Skin normal.  Puilses normal CAP reifll normal Neck:  No deformities, thyromegaly, masses, or tenderness noted.   Supple with full range of motion without pain. Tinel and Phalen negative  Pulse Ox 98% at rest. Walked around halls for approximately 100 feet without shortness of breath and pulse ox > 97%     Assessment & Plan:

## 2011-10-04 NOTE — Patient Instructions (Addendum)
I will call you if your tests are not good.  Otherwise I will send you a letter.  If you do not hear from me with in 2 weeks please call our office.     Come back if the hand is worsening or you have weakness or swelling  You should walk at least 20 minutes every day for exercise

## 2011-10-04 NOTE — Assessment & Plan Note (Signed)
New without hypoxia.  No evident pulmonary or CV disease.  Will check TSH and follow.  Recent cbc and cmet normal

## 2011-10-05 ENCOUNTER — Encounter: Payer: Self-pay | Admitting: Family Medicine

## 2011-10-07 ENCOUNTER — Telehealth: Payer: Self-pay | Admitting: *Deleted
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
## 2011-10-07 NOTE — Telephone Encounter (Signed)
I checked & there was no appt made.I called her & she said she was told there was no appt until the end of the month, so she did not make an appt. I transferred her to the front to see if she can get an appt sooner as she does not want to return to pcp about this

## 2011-10-12 ENCOUNTER — Telehealth: Payer: Self-pay | Admitting: *Deleted

## 2011-10-12 NOTE — Telephone Encounter (Signed)
Patient came by the clinic today with paperwork from 09/28/11. She said she passed out at work and the [employer] were called. She does not remember this incident. She declined to be transported and went to see Dr Deirdre Priest. She has not been back to work since that time as he has not released her to do so. Per the patient Dr Deirdre Priest advised her to be seen by Dr Luciana Axe as this may be due to her HIV. She was wanting Dr Luciana Axe to fill out the paperwork based on the fact that she was coming to see him later this month. Advised her that we can not fill out any paperwork about the 09/28/11 incident as we have not seen her for this issue and I advised her to call Dr Deirdre Priest office and let them know what she needs and maybe they can do it as they saw her. She voiced understanding.

## 2011-10-12 NOTE — Telephone Encounter (Signed)
Patient comes to office wanting to discuss last office visit with Dr. Deirdre Priest. She continues to not be able to close right hand as she feels she should be able to . When she sits for 5-10 minutes her legs go numb.  States she passed out at work on 09/27/2011 and that is one of the reasons she came in to see Dr. Deirdre Priest. She has not been back to work since then . Her job is requiring FMLA form  filled out  and some type of statement   about her passing out and assurance that she is OK to work and not likely to pass out. She was told it is a liability for them.    patient wants a referral to neurologist for the numbness, and right hand decreased strength. Also she has memory loss .    Just came from appointment with Dr. Luciana Axe. Has follow up with him 05/28.    FMLA form placed in MD box. Call patient back at [phone]

## 2011-10-12 NOTE — Telephone Encounter (Signed)
Spoke with her is concerned about anxiety and whether will have an attack at work.  Hand still won't close.  No mention of syncope.  Very rapid speech.   No focal weakness except her hand.  Is oriented and directable Will see her at 830 in AM

## 2011-10-13 ENCOUNTER — Encounter: Payer: Self-pay | Admitting: Family Medicine

## 2011-10-13 ENCOUNTER — Telehealth: Payer: Self-pay | Admitting: *Deleted

## 2011-10-13 ENCOUNTER — Ambulatory Visit (INDEPENDENT_AMBULATORY_CARE_PROVIDER_SITE_OTHER): Payer: 59 | Admitting: Family Medicine

## 2011-10-13 ENCOUNTER — Other Ambulatory Visit: Payer: Self-pay | Admitting: Family Medicine

## 2011-10-13 VITALS — BP 116/75 | HR 82 | Temp 97.8°F | Ht 66.0 in | Wt 174.8 lb

## 2011-10-13 DIAGNOSIS — F319 Bipolar disorder, unspecified: Secondary | ICD-10-CM

## 2011-10-13 DIAGNOSIS — M79609 Pain in unspecified limb: Secondary | ICD-10-CM

## 2011-10-13 DIAGNOSIS — M79643 Pain in unspecified hand: Secondary | ICD-10-CM

## 2011-10-13 NOTE — Telephone Encounter (Signed)
She called concerned about her anxiety & stress. Fears getting another panic attack.  States she is distressed & overwhelmed. Saw her pcp but wants to see Dr. Luciana Axe about this. Wants to find out why she is having these issues. I told her there is no blood test, but a good therapist can help. I recommend Lawanna Kobus who works here. She states she has seen her twice & she felt better each time she had talked with her. I urged her to make another appt with her. State she will call her when we get off phone Says that there is a form that if the md fills it out, she can get paid for her FLMA time out of work due to visits here. She is a Animal nutritionist for [employer]. She will drop it off or bring to her visit with Dr. Luciana Axe later this month.

## 2011-10-13 NOTE — Patient Instructions (Signed)
Take Ibuprofen 2 tabs three times daily with food for your R hand  Make an appointment to see me in 2 weeks  I will call Allysa to discuss your best treatment.  I will call you in the next 2 days  If you feel very depressed or have specific weakness in one hand or leg or have severe sudden headache go to the ER

## 2011-10-14 ENCOUNTER — Telehealth: Payer: Self-pay | Admitting: *Deleted

## 2011-10-14 DIAGNOSIS — F319 Bipolar disorder, unspecified: Secondary | ICD-10-CM | POA: Insufficient documentation

## 2011-10-14 NOTE — Assessment & Plan Note (Signed)
More consistent today with overuse musculoskeletal  Pain in her index finger.  Recommend limited use and nsaids

## 2011-10-14 NOTE — Telephone Encounter (Signed)
To dr. Luciana Axe

## 2011-10-14 NOTE — Progress Notes (Signed)
  Subjective:    Patient ID: Yolanda Hatfield, female    DOB: Apr 02, 1981, 31 y.o.   MRN: [id number]  HPI  Anxiety Mood Often feels very irritable and stressed.  Sometimes sad and tearful.  Difficult to take care of 31 year old and work.   Has been treated for depression in the past and believes perhaps had diagnosis of bipolar.  No suicidal ideation but sometimes feels numb.   Has seen Allysa counselor at [facility] once and is trying to make another appointment with her  R hand describes inability to close now more pain than numbness centered around her index finger.  Painful over MCP when flexs.  No focal weakness or visual changes.  No known trauma.  Typing at work makes this hurt more.  No rashes or other joint pain  Headaches Come and go.  Worse with stress.  No syncope.  ROS see above  Review of Symptoms - see HPI  PMH - Smoking status noted.      Review of Systems     Objective:   Physical Exam  R hand - all joints appear normal.  Pain with movement of index finder mainly over mcp joint.  Negative finkelsteins.  No weakness but is painful  Neurologic exam : Cn 2-7 intact Strength equal & normal in upper & lower extremities Able to walk on heels and toes.   Balance normal  Romberg normal, finger to nose  Psych - interactive becomes tearful at times and then angry "why dont you send me to someone who can fix this" No hallucinations, is oriented      Assessment & Plan:

## 2011-10-14 NOTE — Telephone Encounter (Signed)
Message copied by Mosetta Putt on Thu Oct 14, 2011  2:01 PM ------      Message from: CHAMBLISS, MARSHALL L      Created: Thu Oct 14, 2011 12:23 PM       Dr Hilary Hertz.  I'm [name]'s PCP.  I have concerns that she has worsening Bipolar Disorder.  While she has had issues consistent with this in the past it seems to be causing her more problems.            Two questions      1. Could any of her HIV medications be worsening this?      2. Would common medications to treat bipolar - Lamictal, Seroquel or Depakote interact with her current HIV regimen?            I've CC'd Kennon Rounds on this since I see she has been in contact with Elia via phone.  I agree with her recommendation that [name] should follow up with Caroleen Hamman for counseling            Thanks            [employer]

## 2011-10-14 NOTE — Assessment & Plan Note (Signed)
Given todays presentation her long history of risky sexual behavior and sub-manic clinic presentations in the past as well as her Mood Disorder Questionnaire score of 5 (given my previous interactions with her I would have scored a Yes in several categories where she did not) as well as that it is causing Serious Problems she fits with Bipolar Disorder.   I feel we should start medication therapy.  Will contact her ID specialist to determine if any of her current medications could be worsening this and if any interactions with possible therapies

## 2011-10-15 ENCOUNTER — Telehealth: Payer: Self-pay | Admitting: Family Medicine

## 2011-10-15 DIAGNOSIS — M79643 Pain in unspecified hand: Secondary | ICD-10-CM

## 2011-10-15 NOTE — Assessment & Plan Note (Addendum)
Filled out FMLA.  Related should not use R hand repetitively - can use for 15 mn then rest for 15 min

## 2011-10-15 NOTE — Telephone Encounter (Signed)
Filled out FMLA and left at [employer]

## 2011-10-15 NOTE — Telephone Encounter (Signed)
Need to have the original of her FMLA even if the forms haven't been completed.  Please call her at [phone]

## 2011-10-20 NOTE — Telephone Encounter (Signed)
Thanks for the note Yolanda Hatfield.  Her HIV medicines should not be exacerbating her mental health issues with the regimen she is on.  Any of the medicines you mentioned will be fine to prescribe with her HIV medicines.  It is of course, very common to have biopolar and HIV.  Thanks for your assitance with this and let me know if I can be of help.  Hopefully she can get help with [name].   Best,  [employer]

## 2011-10-21 ENCOUNTER — Telehealth: Payer: Self-pay | Admitting: Family Medicine

## 2011-10-21 NOTE — Telephone Encounter (Signed)
Called patient and told her that Dr Deirdre Priest filled them out and left them up for her to pick up .

## 2011-10-21 NOTE — Telephone Encounter (Signed)
Patient is calling to find out what exactly was faxed by our office today pertaining to her FMLA.

## 2011-10-26 ENCOUNTER — Ambulatory Visit: Payer: 59 | Admitting: Internal Medicine

## 2011-11-02 ENCOUNTER — Telehealth: Payer: Self-pay | Admitting: Family Medicine

## 2011-11-02 NOTE — Telephone Encounter (Signed)
They faxed copy of FMLA questioning whether she had been given total disability Item #9 on form.  I had left this line blank.  On the copy they faxed me it had been filled in from4/29-6/3.   I told Ms Yolanda Hatfield I had not written in those dates

## 2011-11-04 ENCOUNTER — Telehealth: Payer: Self-pay | Admitting: *Deleted

## 2011-11-04 NOTE — Telephone Encounter (Signed)
Patient comes to office today very upset because she went to work today and was told she is terminated from her job.   She wants  documentation that Dr. Deirdre Priest filled out forms and  she picked up.  Consulted with Dr. Deirdre Priest and  he advises OK to print out documentation about filling out forms . I printed out  telephone notes from 05/17 and 05/23 and gave to patient..    States that she  is told by her employer something about #9 on the FMLA form about days of disability. Dr. Deirdre Priest had stated that he left this blank and employer states it was filled out. Patient states she picked up FLMA papers and took it to employer . It is noted in " pick up log " that she signed for it on 05/17. She wonders if something could have been faxed from our office prior to  her picking it up.. I looked thru faxes from the dates in question 05/17, 05/20 and 05/23  and cannot find any papers that were faxed.    Consulted Dr. Deirdre Priest and he advises that he  or Dr. Leveda Anna  will call patient . [phone]

## 2011-11-05 NOTE — Telephone Encounter (Signed)
Called her and discussed with her  She relates she picked up the FMLA form that I had left for her at our front office and gave it to her work  She feels that another copy that was different from the one I filled out and she picked up was faxed from somewhere else.  That faxed copy must have been altered.   She is waiting to hear from her work or insurance what number the faxed copy came.  As far as I know no faxed copy of the FMLA form that I completed and left for her to pick up was ever faxed from this office

## 2011-11-05 NOTE — Telephone Encounter (Signed)
callled left voicemail [phone] for her to call back

## 2011-11-05 NOTE — Telephone Encounter (Signed)
Is asking to speak with Larita Fife, she has some good news!

## 2011-11-08 ENCOUNTER — Telehealth: Payer: Self-pay | Admitting: *Deleted

## 2011-11-08 NOTE — Telephone Encounter (Signed)
Dr. Leveda Anna filled out new FMLA based on office visits. Faxed to Aldean Baker at [place] at [phone]  as instructed by Dr. Leveda Anna. Form placed in file in front office for patient to pick up.

## 2011-11-08 NOTE — Telephone Encounter (Addendum)
Late entry. I spoke with patient Friday afternoon approx 4:15 PM. States she has spoken with her employer and they need new paper work filled out for [employer] and gives me a  statement for each question and what it needs to state.   States Dr. Deirdre Priest told her that I would be calling her today  and I can take care of whatever is needed and she needs this taken care of today. States  her employer has found that nothing was faxed from our office. States another employee has her same ID number.    RN then called Dr. Deirdre Priest who is out of office  this afternoon and he states he will fill out whatever needs filling out when he returns from vacation on 06/17. I told patient this and she stated she doesn't understand and wants her medical records and she is coming to our office now. Dr. Leveda Anna notified and RN and Dr. Leveda Anna met with patient and a gentleman that she introduced as her fiance.  After discussion Dr. Leveda Anna advised patient he will fill out a new form based on noted from chart for Dr. Deirdre Priest.  Patient can pick up at 4:00 Monday 06/10.

## 2011-11-12 ENCOUNTER — Telehealth: Payer: Self-pay | Admitting: Family Medicine

## 2011-11-12 NOTE — Telephone Encounter (Signed)
Will place in mail  now to go out Monday afternoon.

## 2011-11-12 NOTE — Telephone Encounter (Signed)
Yolanda Hatfield need you to mail her the original FMLA papers.

## 2011-11-16 ENCOUNTER — Ambulatory Visit: Payer: 59 | Admitting: Internal Medicine

## 2011-11-16 ENCOUNTER — Telehealth: Payer: Self-pay | Admitting: *Deleted

## 2011-11-16 NOTE — Telephone Encounter (Signed)
Called patient to reschedule her appt and she said she called this AM to reschedule. Spoke to [name] and she said patient didn't call until 9:18 which was after her appt time.  Tried to reschedule and she said she had to check her therapy appts to see when she could come and would call back. Yolanda Hatfield

## 2012-01-26 ENCOUNTER — Telehealth: Payer: Self-pay | Admitting: *Deleted

## 2012-01-26 NOTE — Telephone Encounter (Signed)
Patient is returning call to [facility].

## 2012-01-26 NOTE — Telephone Encounter (Signed)
Patient completed form to request PCP change on 02/25/12 for herself, and children Colin Mulders Hatley--04/27/97, Michael Lento--06/03/99, and Journey Mayson--01/19/10).    Reason for request---"Just needing to change PCP."  Returned call to patient for more information.  Left message to call our office back.  Gaylene Brooks, RN

## 2012-01-28 ENCOUNTER — Encounter: Payer: Self-pay | Admitting: *Deleted

## 2012-01-28 NOTE — Telephone Encounter (Addendum)
Returned call to patient.  Patient states she wants to change to a female PCP due to having two daughters.  Wants an MD that is "more aware with female issues" and states "we've outgrown each other."  Patient then requested to change to Will inform Dr. Deirdre Priest of patient's request.

## 2012-03-31 ENCOUNTER — Other Ambulatory Visit: Payer: 59

## 2012-07-21 ENCOUNTER — Telehealth: Payer: Self-pay | Admitting: *Deleted

## 2012-07-21 NOTE — Telephone Encounter (Signed)
Called patient and left a message for her to call the office to schedule an appt asap.

## 2012-09-07 ENCOUNTER — Telehealth: Payer: Self-pay | Admitting: *Deleted

## 2012-09-07 NOTE — Telephone Encounter (Signed)
Message left requesting pt to call for PAP smear appt.

## 2014-02-28 NOTE — Telephone Encounter (Signed)
This encounter was created in error - please disregard.

## 2015-11-23 ENCOUNTER — Emergency Department (HOSPITAL_COMMUNITY)
Admission: EM | Admit: 2015-11-23 | Discharge: 2015-11-23 | Disposition: A | Discharge status: Home / Self Care | Payer: Self-pay | Attending: Emergency Medicine | Admitting: Emergency Medicine

## 2015-11-23 ENCOUNTER — Encounter (HOSPITAL_COMMUNITY): Payer: Self-pay

## 2015-11-23 DIAGNOSIS — N390 Urinary tract infection, site not specified: Secondary | ICD-10-CM | POA: Insufficient documentation

## 2015-11-23 DIAGNOSIS — H73012 Bullous myringitis, left ear: Secondary | ICD-10-CM | POA: Insufficient documentation

## 2015-11-23 DIAGNOSIS — F17210 Nicotine dependence, cigarettes, uncomplicated: Secondary | ICD-10-CM | POA: Insufficient documentation

## 2015-11-23 DIAGNOSIS — Z7982 Long term (current) use of aspirin: Secondary | ICD-10-CM | POA: Insufficient documentation

## 2015-11-23 LAB — URINALYSIS, ROUTINE W REFLEX MICROSCOPIC
BILIRUBIN URINE: NEGATIVE
GLUCOSE, UA: NEGATIVE mg/dL
KETONES UR: NEGATIVE mg/dL
Nitrite: NEGATIVE
PH: 6 (ref 5.0–8.0)
Protein, ur: NEGATIVE mg/dL
Specific Gravity, Urine: 1.008 (ref 1.005–1.030)

## 2015-11-23 LAB — URINE MICROSCOPIC-ADD ON: RBC / HPF: NONE SEEN RBC/hpf (ref 0–5)

## 2015-11-23 LAB — POC URINE PREG, ED: PREG TEST UR: NEGATIVE

## 2015-11-23 MED ORDER — LEVOFLOXACIN 500 MG PO TABS
500.0000 mg | ORAL_TABLET | Freq: Once | ORAL | Status: AC
  Administered 2015-11-23: 500 mg via ORAL
  Filled 2015-11-23: qty 1

## 2015-11-23 MED ORDER — ACETAMINOPHEN 325 MG PO TABS
650.0000 mg | ORAL_TABLET | Freq: Once | ORAL | Status: AC
  Administered 2015-11-23: 650 mg via ORAL
  Filled 2015-11-23: qty 2

## 2015-11-23 MED ORDER — LEVOFLOXACIN 500 MG PO TABS: 500.0000 mg | ORAL_TABLET | Freq: Every day | ORAL | Status: DC

## 2015-11-23 NOTE — ED Provider Notes (Signed)
CSN: [id number]     Arrival date & time 11/23/15  1254 History   First MD Initiated Contact with Patient 11/23/15 1434     Chief Complaint  Patient presents with  . Back Pain  . Otalgia     The history is provided by the patient. No language interpreter was used.   Yolanda Hatfield is a 35 y.o. female who presents to the Emergency Department complaining of back pain, ear ache.  She reports two weeks of "cold symptoms" with left sided ear ache, decreased hearing in the left ear, sore throat, nasal congestion, cough, fevers.  Her cold has been improving but for the last two days she Has experienced right-sided back pain and pain with urination. She has persistent decreased hearing and pain in the left ear. No vaginal discharge, wound pain, vomiting.  Past Medical History  Diagnosis Date  . HIV infection (HCC)    History reviewed. No pertinent past surgical history. Family History  Problem Relation Age of Onset  . Diabetes Mother   . Stroke Mother    Social History  Substance Use Topics  . Smoking status: Current Every Day Smoker -- 2 years    Types: Cigarettes  . Smokeless tobacco: Never Used     Comment: 1 pack last 2 weeks  . Alcohol Use: No   OB History    No data available     Review of Systems  All other systems reviewed and are negative.     Allergies  Cephalexin; Codeine phosphate; and Sulfonamide derivatives  Home Medications   Prior to Admission medications   Medication Sig Start Date End Date Taking? Authorizing Provider  Aspirin-Acetaminophen-Caffeine (GOODY HEADACHE PO) Take 1 packet by mouth every 8 (eight) hours as needed.   Yes Historical Provider, MD  Emollient (QC SWEET OIL EX) Apply 5 drops topically daily as needed (ear pain).   Yes Historical Provider, MD  ibuprofen (ADVIL,MOTRIN) 200 MG tablet Take 200 mg by mouth every 6 (six) hours as needed.   Yes Historical Provider, MD  atazanavir (REYATAZ) 300 MG capsule Take 1 capsule (300 mg total) by mouth  daily with breakfast. 07/14/11 07/13/12  Judyann Munsonynthia Snider, MD  emtricitabine-tenofovir (TRUVADA) 200-300 MG per tablet Take 1 tablet by mouth daily. 07/14/11 07/13/12  Judyann Munsonynthia Snider, MD  levofloxacin (LEVAQUIN) 500 MG tablet Take 1 tablet (500 mg total) by mouth daily. 11/24/15   Tilden FossaElizabeth Winfred Redel, MD  ritonavir (NORVIR) 100 MG TABS Take 1 tablet (100 mg total) by mouth daily with breakfast. 07/14/11   Judyann Munsonynthia Snider, MD   BP 111/68 mmHg  Pulse 90  Temp(Src) 99 F (37.2 C) (Oral)  Resp 20  SpO2 100%  LMP 10/30/2015 Physical Exam  Constitutional: She is oriented to person, place, and time. She appears well-developed and well-nourished.  HENT:  Head: Normocephalic and atraumatic.  Mouth/Throat: Oropharynx is clear and moist.  Right TM normal. Left TM with multiple bullae.  Neck: Neck supple.  Cardiovascular: Normal rate and regular rhythm.   No murmur heard. Pulmonary/Chest: Effort normal and breath sounds normal. No respiratory distress.  Abdominal: Soft. There is no rebound and no guarding.  Mild right CVA tenderness  Musculoskeletal: She exhibits no edema or tenderness.  Neurological: She is alert and oriented to person, place, and time.  Skin: Skin is warm and dry.  Psychiatric: She has a normal mood and affect. Her behavior is normal.  Nursing note and vitals reviewed.   ED Course  Procedures (including critical care time) Labs  Review Labs Reviewed  URINALYSIS, ROUTINE W REFLEX MICROSCOPIC (NOT AT [facility]) - Abnormal; Notable for the following:    APPearance CLOUDY (*)    Hgb urine dipstick TRACE (*)    Leukocytes, UA MODERATE (*)    All other components within normal limits  URINE MICROSCOPIC-ADD ON - Abnormal; Notable for the following:    Squamous Epithelial / LPF 0-5 (*)    Bacteria, UA MANY (*)    All other components within normal limits  URINE CULTURE  POC URINE PREG, ED    Imaging Review No results found. I have personally reviewed and evaluated these images and lab  results as part of my medical decision-making.   EKG Interpretation None      MDM   Final diagnoses:  Acute UTI  Bullous myringitis, left    Patient here for evaluation of right side pain, left ear pain, mild cough. Review notes that she has a history of HIV. Discussed with patient her history of HIV and recent follow-up and care. She has not had follow-up in the last 6 months and her last CD4 count and viral load were "ok" 6 months ago. She has been off of her medications the last 6 months. [place] in [place]. Discussed recommendations for doing lab analysis as well as chest x-ray given her medical history. Discussed that she is at increased risk for serious bacterial infections. Patient understands these risks but does not want any further testing done in the emergency department today. She declines chest x-ray or lab draw. We will treat for bullous myringitis as well as UTI with Levaquin given the patient's allergies. Discussed the importance of home care, outpatient follow-up and return precautions.    Tilden FossaElizabeth Ruby Logiudice, MD 11/23/15 [phone]

## 2015-11-23 NOTE — ED Notes (Signed)
Patient here with left ear stopped up x 1 week after cold symptoms. Also complains of right sided back pain with same x 1 week. Also has dysuria for the past few days

## 2015-11-23 NOTE — Discharge Instructions (Signed)
Please get rechecked immediately if you have any new or worrisome symptoms.   Urinary Tract Infection Urinary tract infections (UTIs) can develop anywhere along your urinary tract. Your urinary tract is your body's drainage system for removing wastes and extra water. Your urinary tract includes two kidneys, two ureters, a bladder, and a urethra. Your kidneys are a pair of bean-shaped organs. Each kidney is about the size of your fist. They are located below your ribs, one on each side of your spine. CAUSES Infections are caused by microbes, which are microscopic organisms, including fungi, viruses, and bacteria. These organisms are so small that they can only be seen through a microscope. Bacteria are the microbes that most commonly cause UTIs. SYMPTOMS  Symptoms of UTIs may vary by age and gender of the patient and by the location of the infection. Symptoms in [place] women typically include a frequent and intense urge to urinate and a painful, burning feeling in the bladder or urethra during urination. Older women and men are more likely to be tired, shaky, and weak and have muscle aches and abdominal pain. A fever may mean the infection is in your kidneys. Other symptoms of a kidney infection include pain in your back or sides below the ribs, nausea, and vomiting. DIAGNOSIS To diagnose a UTI, your caregiver will ask you about your symptoms. Your caregiver will also ask you to provide a urine sample. The urine sample will be tested for bacteria and white blood cells. White blood cells are made by your body to help fight infection. TREATMENT  Typically, UTIs can be treated with medication. Because most UTIs are caused by a bacterial infection, they usually can be treated with the use of antibiotics. The choice of antibiotic and length of treatment depend on your symptoms and the type of bacteria causing your infection. HOME CARE INSTRUCTIONS  If you were prescribed antibiotics, take them exactly as  your caregiver instructs you. Finish the medication even if you feel better after you have only taken some of the medication.  Drink enough water and fluids to keep your urine clear or pale yellow.  Avoid caffeine, tea, and carbonated beverages. They tend to irritate your bladder.  Empty your bladder often. Avoid holding urine for long periods of time.  Empty your bladder before and after sexual intercourse.  After a bowel movement, women should cleanse from front to back. Use each tissue only once. SEEK MEDICAL CARE IF:   You have back pain.  You develop a fever.  Your symptoms do not begin to resolve within 3 days. SEEK IMMEDIATE MEDICAL CARE IF:   You have severe back pain or lower abdominal pain.  You develop chills.  You have nausea or vomiting.  You have continued burning or discomfort with urination. MAKE SURE YOU:   Understand these instructions.  Will watch your condition.  Will get help right away if you are not doing well or get worse.   This information is not intended to replace advice given to you by your health care provider. Make sure you discuss any questions you have with your health care provider.   Document Released: 02/24/2005 Document Revised: 02/05/2015 Document Reviewed: 06/25/2011 Elsevier Interactive Patient Education [employer]2016 Elsevier Inc.

## 2015-11-25 LAB — URINE CULTURE: Culture: >=100000 — AB

## 2015-11-26 ENCOUNTER — Telehealth (HOSPITAL_BASED_OUTPATIENT_CLINIC_OR_DEPARTMENT_OTHER): Payer: Self-pay | Admitting: Emergency Medicine

## 2015-11-26 NOTE — Telephone Encounter (Signed)
Post ED Visit - Positive Culture Follow-up  Culture report reviewed by antimicrobial stewardship pharmacist:  []  Enzo BiNathan Batchelder, Pharm.D. []  Celedonio MiyamotoJeremy Frens, [address] BCPS []  Garvin FilaMike Maccia, Pharm.D. []  Georgina PillionElizabeth Martin, [address] BCPS []  SherrillMinh Pham, [address] BCPS, AAHIVP []  Estella HuskMichelle Turner, Pharm.D., BCPS, AAHIVP [x]  Tennis Mustassie Stewart, Pharm.D. []  Sherle Poeob Vincent, [address]  Positive urine culture Treated with levofloxacin, organism sensitive to the same and no further patient follow-up is required at this time.  Berle MullMiller, Yunuen Mordan 11/26/2015, 11:30 AM

## 2016-04-01 ENCOUNTER — Ambulatory Visit (HOSPITAL_COMMUNITY)
Admission: EM | Admit: 2016-04-01 | Discharge: 2016-04-01 | Disposition: A | Discharge status: Home / Self Care | Payer: Medicaid Other | Attending: Internal Medicine | Admitting: Internal Medicine

## 2016-04-01 ENCOUNTER — Encounter (HOSPITAL_COMMUNITY): Payer: Self-pay | Admitting: Emergency Medicine

## 2016-04-01 DIAGNOSIS — L03011 Cellulitis of right finger: Secondary | ICD-10-CM

## 2016-04-01 MED ORDER — CLINDAMYCIN HCL 150 MG PO CAPS: 150.0000 mg | ORAL_CAPSULE | Freq: Three times a day (TID) | ORAL | 0 refills | Status: AC

## 2016-04-01 MED ORDER — DOXYCYCLINE HYCLATE 100 MG PO CAPS: 100.0000 mg | ORAL_CAPSULE | Freq: Two times a day (BID) | ORAL | 0 refills | Status: AC

## 2016-04-01 MED ORDER — NAPROXEN 500 MG PO TABS: 500.0000 mg | ORAL_TABLET | Freq: Two times a day (BID) | ORAL | 0 refills | Status: DC

## 2016-04-01 NOTE — ED Triage Notes (Signed)
Pt c/o right index finger pain onset 2 days... Reports she bites her fingernail and now has swelling around nail bed of finger  Denies fevers  A&O x4... NAD

## 2016-04-01 NOTE — Discharge Instructions (Addendum)
Followup with infectious disease center as planned.  Prescriptions for antibiotics were sent to the [facility] on [address]  Recheck if new fever >100.5 or finger pain not improving in the next day or two; there does not appear to be a pus collection at this time to drain.  Soak finger in warm water 1-2 times daily for 5-10 minutes and put antibiotic ointment on the corner of the cuticle, to keep it soft.

## 2016-04-01 NOTE — ED Provider Notes (Signed)
[facility]    CSN: [id number] Arrival date & time: 04/01/16  1510     History   Chief Complaint Chief Complaint  Patient presents with  . Hand Pain    HPI Yolanda Hatfield is a 35 y.o. female. She presents today with severe discomfort in her right index finger, around the nail. She bites her nails, and is worried about infection around the nail. Swelling started 2 days ago, and there has been some drainage. Her finger is throbbing today. No fever, no malaise. History of HIV, not currently on meds. Recently moved back to this area, and plans to follow-up with infectious disease center.  HPI  Past Medical History:  Diagnosis Date  . HIV infection [facility](HCC)     Patient Active Problem List   Diagnosis Date Noted  . Bipolar affective disorder (HCC) 10/14/2011  . Hand pain 10/04/2011  . Dyspnea 10/04/2011  . Contraceptive management 07/08/2010  . ALLERGIC RHINITIS 10/18/2007  . HIV DISEASE 12/27/2006  . 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 No pertinent surgical history.     Home Medications    Prior to Admission medications   Medication Sig Start Date End Date Taking? Authorizing Provider  Aspirin-Acetaminophen-Caffeine (GOODY HEADACHE PO) Take 1 packet by mouth every 8 (eight) hours as needed.    Historical Provider, MD  atazanavir (REYATAZ) 300 MG capsule Take 1 capsule (300 mg total) by mouth daily with breakfast. 07/14/11 07/13/12  Judyann Munsonynthia Snider, MD  clindamycin (CLEOCIN) 150 MG capsule Take 1 capsule (150 mg total) by mouth 3 (three) times daily. 04/01/16 04/08/16  Eustace MooreLaura W Murray, MD  doxycycline (VIBRAMYCIN) 100 MG capsule Take 1 capsule (100 mg total) by mouth 2 (two) times daily. 04/01/16 04/08/16  Eustace MooreLaura W Murray, MD  Emollient (QC SWEET OIL EX) Apply 5 drops topically daily as needed (ear pain).    Historical Provider, MD  emtricitabine-tenofovir (TRUVADA) 200-300 MG per tablet Take 1 tablet by mouth daily. 07/14/11 07/13/12  Judyann Munsonynthia Snider, MD    ibuprofen (ADVIL,MOTRIN) 200 MG tablet Take 200 mg by mouth every 6 (six) hours as needed.    Historical Provider, MD  naproxen (NAPROSYN) 500 MG tablet Take 1 tablet (500 mg total) by mouth 2 (two) times daily. 04/01/16   Eustace MooreLaura W Murray, MD  ritonavir (NORVIR) 100 MG TABS Take 1 tablet (100 mg total) by mouth daily with breakfast. 07/14/11   Judyann Munsonynthia Snider, MD    Family History Family History  Problem Relation Age of Onset  . Diabetes Mother   . Stroke Mother     Social History Social History  Substance Use Topics  . Smoking status: Current Every Day Smoker    Years: 2.00    Types: Cigarettes  . Smokeless tobacco: Never Used     Comment: 1 pack last 2 weeks  . Alcohol use No     Allergies   Cephalexin; Codeine phosphate; Sulfa antibiotics; and Sulfonamide derivatives   Review of Systems Review of Systems  All other systems reviewed and are negative.    Physical Exam Triage Vital Signs ED Triage Vitals  Enc Vitals Group     BP 04/01/16 1615 120/67     Pulse Rate 04/01/16 1615 68     Resp 04/01/16 1615 14     Temp 04/01/16 1615 98.5 F (36.9 C)     Temp Source 04/01/16 1615 Oral     SpO2 04/01/16 1615 100 %     Weight --  Height --      Pain Score 04/01/16 1619 6   Updated Vital Signs BP 120/67 (BP Location: Left Arm)   Pulse 68   Temp 98.5 F (36.9 C) (Oral)   Resp 14   LMP 03/19/2016   SpO2 100%      Physical Exam  Constitutional: She is oriented to person, place, and time. No distress.  Alert, nicely groomed  HENT:  Head: Atraumatic.  Eyes:  Conjugate gaze, no eye redness/drainage  Neck: Neck supple.  Cardiovascular: Normal rate.   Pulmonary/Chest: No respiratory distress.  Abdominal: She exhibits no distension.  Musculoskeletal: Normal range of motion.  No leg swelling  Neurological: She is alert and oriented to person, place, and time.  Skin: Skin is warm and dry.  No cyanosis Patient is right handed. Mild distal swelling of the right  second finger, just proximal to the fingernail. There is a shallow erosion at the ulnar aspect of the cuticle. No fluctuance. No drainage expressed. Quite tender.  Nursing note and vitals reviewed.    UC Treatments / Results    Procedures Procedures (including critical care time)    Final Clinical Impressions(s) / UC Diagnoses   Final diagnoses:  Paronychia of right index finger   Followup with infectious disease center as planned.  Prescriptions for antibiotics were sent to the [facility] on [address]  Recheck if new fever >100.5 or finger pain not improving in the next day or two; there does not appear to be a pus collection at this time to drain.  Soak finger in warm water 1-2 times daily for 5-10 minutes and put antibiotic ointment on the corner of the cuticle, to keep it soft.  New Prescriptions New Prescriptions   CLINDAMYCIN (CLEOCIN) 150 MG CAPSULE    Take 1 capsule (150 mg total) by mouth 3 (three) times daily.   DOXYCYCLINE (VIBRAMYCIN) 100 MG CAPSULE    Take 1 capsule (100 mg total) by mouth 2 (two) times daily.   NAPROXEN (NAPROSYN) 500 MG TABLET    Take 1 tablet (500 mg total) by mouth 2 (two) times daily.     Eustace MooreLaura W Murray, MD 04/05/16 2226

## 2016-04-15 ENCOUNTER — Telehealth (HOSPITAL_COMMUNITY): Payer: Self-pay | Admitting: Emergency Medicine

## 2016-04-15 MED ORDER — FLUCONAZOLE 150 MG PO TABS: 150.0000 mg | ORAL_TABLET | Freq: Every day | ORAL | 0 refills | Status: DC

## 2016-04-15 NOTE — Telephone Encounter (Signed)
Pt called wanting to know if we can call in a Rx in for Diflucan  States she was seen here on 11/2 and was given to antibiotics and has now developed vaginal itching  Per Lorre MunroeAnne Amyott, NP... Ok to call in Diflucan 150 mg 1 tab PO and repeat in 72 hrs if not getting any better.   Per pt's request, [facility] [address]

## 2017-06-18 ENCOUNTER — Encounter (HOSPITAL_COMMUNITY): Payer: Self-pay | Admitting: *Deleted

## 2017-06-18 ENCOUNTER — Ambulatory Visit (HOSPITAL_COMMUNITY)
Admission: EM | Admit: 2017-06-18 | Discharge: 2017-06-18 | Disposition: A | Discharge status: Home / Self Care | Payer: BLUE CROSS/BLUE SHIELD | Attending: Family Medicine | Admitting: Family Medicine

## 2017-06-18 DIAGNOSIS — L03012 Cellulitis of left finger: Secondary | ICD-10-CM

## 2017-06-18 MED ORDER — CLINDAMYCIN HCL 150 MG PO CAPS: 450.0000 mg | ORAL_CAPSULE | Freq: Three times a day (TID) | ORAL | 0 refills | Status: AC

## 2017-06-18 MED ORDER — FLUCONAZOLE 150 MG PO TABS: 150.0000 mg | ORAL_TABLET | Freq: Every day | ORAL | 0 refills | Status: DC

## 2017-06-18 MED ORDER — MELOXICAM 15 MG PO TABS: 15.0000 mg | ORAL_TABLET | Freq: Every day | ORAL | 0 refills | Status: DC

## 2017-06-18 NOTE — Discharge Instructions (Signed)
Start clindamycin as directed.  I have called in Diflucan for you as well.  Warm compresses, Epson salt soaks as directed on instructions. If symptoms worsen in the next 24-48 hours, spreading redness, increased warmth, fever, chills, discoloration of the fingertip, go to the emergency department for further evaluation.

## 2017-06-18 NOTE — ED Triage Notes (Signed)
Per pt her pointer finger on her left hand is swollen, per pt she tried to pull a hang nail 2 days ago.

## 2017-06-18 NOTE — ED Provider Notes (Signed)
[facility]    CSN: [id number] Arrival date & time: 06/18/17  1419     History   Chief Complaint Chief Complaint  Patient presents with  . Finger Injury    HPI Yolanda Hatfield is a 37 y.o. female.   37 year old female with history of HIV, bipolar comes in for 2-day history of left pointer finger swelling.  Patient states she removed a hangnail 2 days ago and has since then had increased swelling with pain at the site.  She denies spreading erythema, increased warmth, fever.  She has been doing ice compress with Epson salt soaks with some relief.   Patient states HIV is well controlled, last followed with ID a few months ago, continues to take medications as directed.  She does not know what her last CD4 count and viral load was.      Past Medical History:  Diagnosis Date  . HIV infection [facility])     Patient Active Problem List   Diagnosis Date Noted  . Bipolar affective disorder (HCC) 10/14/2011  . Hand pain 10/04/2011  . Dyspnea 10/04/2011  . Contraceptive management 07/08/2010  . ALLERGIC RHINITIS 10/18/2007  . HIV DISEASE 12/27/2006  . 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 No pertinent surgical history.  OB History    No data available       Home Medications    Prior to Admission medications   Medication Sig Start Date End Date Taking? Authorizing Provider  HYDROcodone-acetaminophen (NORCO/VICODIN) 5-325 MG tablet Take 1 tablet by mouth every 6 (six) hours as needed for moderate pain.   Yes [provider]  naproxen (NAPROSYN) 500 MG tablet Take 1 tablet (500 mg total) by mouth 2 (two) times daily. 04/01/16  Yes Isa Rankin, MD  tiZANidine (ZANAFLEX) 4 MG capsule Take 4 mg by mouth 3 (three) times daily.   Yes [provider]  traMADol (ULTRAM) 50 MG tablet Take 50 mg by mouth every 6 (six) hours as needed.   Yes [provider]  Aspirin-Acetaminophen-Caffeine (GOODY HEADACHE PO) Take 1 packet by mouth  every 8 (eight) hours as needed.    [provider]  atazanavir (REYATAZ) 300 MG capsule Take 1 capsule (300 mg total) by mouth daily with breakfast. 07/14/11 07/13/12  Judyann Munson, MD  clindamycin (CLEOCIN) 150 MG capsule Take 3 capsules (450 mg total) by mouth 3 (three) times daily for 7 days. 06/18/17 06/25/17  Belinda Fisher, PA-C  Emollient (QC SWEET OIL EX) Apply 5 drops topically daily as needed (ear pain).    [provider]  emtricitabine-tenofovir (TRUVADA) 200-300 MG per tablet Take 1 tablet by mouth daily. 07/14/11 07/13/12  Judyann Munson, MD  fluconazole (DIFLUCAN) 150 MG tablet Take 1 tablet (150 mg total) by mouth daily. Take second dose 72 hours later if symptoms still persists. 06/18/17   Cathie Hoops, Ricky Gallery V, PA-C  ibuprofen (ADVIL,MOTRIN) 200 MG tablet Take 200 mg by mouth every 6 (six) hours as needed.    [provider]  meloxicam (MOBIC) 15 MG tablet Take 1 tablet (15 mg total) by mouth daily. 06/18/17   Belinda Fisher, PA-C  ritonavir (NORVIR) 100 MG TABS Take 1 tablet (100 mg total) by mouth daily with breakfast. 07/14/11   Judyann Munson, MD    Family History Family History  Problem Relation Age of Onset  . Diabetes Mother   . Stroke Mother     Social History Social History   Tobacco Use  . Smoking  status: Current Every Day Smoker    Years: 2.00    Types: Cigarettes  . Smokeless tobacco: Never Used  . Tobacco comment: 1 pack last 2 weeks  Substance Use Topics  . Alcohol use: No  . Drug use: No     Allergies   Cephalexin; Codeine phosphate; Sulfa antibiotics; and Sulfonamide derivatives   Review of Systems Review of Systems  Reason unable to perform ROS: See HPI as above.     Physical Exam Triage Vital Signs ED Triage Vitals  Enc Vitals Group     BP 06/18/17 1547 110/67     Pulse Rate 06/18/17 1547 81     Resp --      Temp 06/18/17 1547 98 F (36.7 C)     Temp Source 06/18/17 1547 Oral     SpO2 06/18/17 1547 100 %     Weight --       Height --      Head Circumference --      Peak Flow --      Pain Score 06/18/17 1540 8     Pain Loc --      Pain Edu? --      Excl. in GC? --    No data found.  Updated Vital Signs BP 110/67 (BP Location: Right Arm)   Pulse 81   Temp 98 F (36.7 C) (Oral)   LMP 05/31/2017 (Exact Date)   SpO2 100%   Physical Exam  Constitutional: She is oriented to person, place, and time. She appears well-developed and well-nourished. No distress.  HENT:  Head: Normocephalic and atraumatic.  Eyes: Conjunctivae are normal. Pupils are equal, round, and reactive to light.  Musculoskeletal:  See picture below. Left pointer finger swelling with slight discharge from nail bed. Patient with tenderness on palpation along the nail bed. Finger pad is hard to palpation, but without tenderness, patient stating decreased sensation. No obvious paronychia, felon. Fingertips are cold due to ice compress. Decreased ROM of the DIP joint due to swelling. Cap refill <2s  Neurological: She is alert and oriented to person, place, and time.          UC Treatments / Results  Labs (all labs ordered are listed, but only abnormal results are displayed) Labs Reviewed - No data to display  EKG  EKG Interpretation None       Radiology No results found.  Procedures Procedures (including critical care time)  Medications Ordered in UC Medications - No data to display   Initial Impression / Assessment and Plan / UC Course  I have reviewed the triage vital signs and the nursing notes.  Pertinent labs & imaging results that were available during my care of the patient were reviewed by me and considered in my medical decision making (see chart for details).    No obvious paronychia/felon today. Will start patient on clindamycin for cellulitis. Continue warm water soak. Patient given strict return precautions. Expresses understanding and agrees to plan.  Discussed case with Dr Tracie Harrier, who agrees to plan.    Final Clinical Impressions(s) / UC Diagnoses   Final diagnoses:  Cellulitis of finger of left hand    ED Discharge Orders        Ordered    meloxicam (MOBIC) 15 MG tablet  Daily     06/18/17 1614    clindamycin (CLEOCIN) 150 MG capsule  3 times daily     06/18/17 1614    fluconazole (DIFLUCAN) 150 MG tablet  Daily  06/18/17 1614       Belinda FisherYu, Novalyn Lajara V, PA-C 06/18/17 1633

## 2017-06-21 ENCOUNTER — Telehealth (HOSPITAL_COMMUNITY): Payer: Self-pay | Admitting: Emergency Medicine

## 2017-06-21 NOTE — Telephone Encounter (Signed)
Patient called concerned for possible reaction to clindamycin.  Reports finger is better, but having hives on left arm.  Denies any oral swelling or difficulty breathing.  Last dose of clindamycin was yesterday and says orthopedic physician told her to stop taking medicine.  Patient requesting doxycycline.    Discussed with amy yu, pa  Changed medicine to doxycycline 100 mg, BID, X 10 days, quantity 20, no refills-[facility] [address] [phone]

## 2017-06-23 ENCOUNTER — Emergency Department (HOSPITAL_COMMUNITY)
Admission: EM | Admit: 2017-06-23 | Discharge: 2017-06-24 | Disposition: A | Discharge status: Home / Self Care | Payer: BLUE CROSS/BLUE SHIELD | Attending: Emergency Medicine | Admitting: Emergency Medicine

## 2017-06-23 ENCOUNTER — Encounter (HOSPITAL_COMMUNITY): Payer: Self-pay | Admitting: Emergency Medicine

## 2017-06-23 ENCOUNTER — Other Ambulatory Visit: Payer: Self-pay

## 2017-06-23 DIAGNOSIS — Z531 Procedure and treatment not carried out because of patient's decision for reasons of belief and group pressure: Secondary | ICD-10-CM | POA: Diagnosis not present

## 2017-06-23 DIAGNOSIS — M79644 Pain in right finger(s): Secondary | ICD-10-CM | POA: Diagnosis present

## 2017-06-23 NOTE — ED Notes (Signed)
First call in lobby for vitals update. No response. 

## 2017-06-23 NOTE — ED Notes (Signed)
Second call in lobby. No response. 

## 2017-06-23 NOTE — ED Triage Notes (Signed)
Pt's left pointer finger is swollen.  She has been treated w/ antibiotics however she had to change the antibiotic due to an allergic reaction.  She has not started her new antibiotic yet as she just picked it up. She reports that on the way to the pharmacy the swelling progressed and the finger started to drain.  She is unable to do anything w/ the hand including lite a cigarette.

## 2017-06-29 ENCOUNTER — Ambulatory Visit (INDEPENDENT_AMBULATORY_CARE_PROVIDER_SITE_OTHER): Payer: BLUE CROSS/BLUE SHIELD | Admitting: Neurology

## 2017-06-29 ENCOUNTER — Encounter: Payer: Self-pay | Admitting: Neurology

## 2017-06-29 VITALS — BP 110/68 | HR 88 | Ht 67.0 in | Wt 167.2 lb

## 2017-06-29 DIAGNOSIS — R252 Cramp and spasm: Secondary | ICD-10-CM

## 2017-06-29 DIAGNOSIS — S134XXA Sprain of ligaments of cervical spine, initial encounter: Secondary | ICD-10-CM

## 2017-06-29 DIAGNOSIS — G44319 Acute post-traumatic headache, not intractable: Secondary | ICD-10-CM
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
## 2017-06-29 NOTE — Patient Instructions (Signed)
I think you will continue to get better.  The physical therapy on the neck and back should continue helping the pain, including the headache.   [facility] and will let you know if I think further testing is needed.

## 2017-07-02 ENCOUNTER — Ambulatory Visit (HOSPITAL_COMMUNITY)
Admission: EM | Admit: 2017-07-02 | Discharge: 2017-07-02 | Disposition: A | Discharge status: Home / Self Care | Payer: BLUE CROSS/BLUE SHIELD | Attending: Family Medicine | Admitting: Family Medicine

## 2017-07-02 ENCOUNTER — Other Ambulatory Visit: Payer: Self-pay

## 2017-07-02 ENCOUNTER — Encounter (HOSPITAL_COMMUNITY): Payer: Self-pay

## 2017-07-02 DIAGNOSIS — M7989 Other specified soft tissue disorders: Secondary | ICD-10-CM | POA: Diagnosis not present

## 2017-07-02 MED ORDER — IBUPROFEN 800 MG PO TABS: 800.0000 mg | ORAL_TABLET | Freq: Three times a day (TID) | ORAL | 0 refills | Status: DC

## 2017-07-02 MED ORDER — CLOTRIMAZOLE-BETAMETHASONE 1-0.05 % EX CREA: TOPICAL_CREAM | CUTANEOUS | 0 refills | Status: DC

## 2017-07-02 MED ORDER — PREDNISONE 10 MG (21) PO TBPK: ORAL_TABLET | Freq: Every day | ORAL | 0 refills | Status: DC

## 2017-07-02 NOTE — ED Triage Notes (Signed)
[facility], Pt's left pointer finger is swollen, pt was seen here on 06/23/17, but finger is not getting any better

## 2017-07-02 NOTE — ED Notes (Signed)
Patient discharged by provider.

## 2017-07-07 NOTE — ED Provider Notes (Signed)
[facility]   [id number] 07/02/17 Arrival Time: 1655  ASSESSMENT & PLAN:  1. Swelling of finger   - dermatitis - does not look like herpetic whitlow  Meds ordered this encounter  Medications  . predniSONE (STERAPRED UNI-PAK 21 TAB) 10 MG (21) TBPK tablet    Sig: Take by mouth daily. Take as directed.    Dispense:  21 tablet    Refill:  0  . clotrimazole-betamethasone (LOTRISONE) cream    Sig: Apply to affected area 2 times daily prn    Dispense:  15 g    Refill:  0  . ibuprofen (ADVIL,MOTRIN) 800 MG tablet    Sig: Take 1 tablet (800 mg total) by mouth 3 (three) times daily.    Dispense:  21 tablet    Refill:  0   If she does not improve with above medications I would recommend that she see a dermatologist.  Reviewed expectations re: course of current medical issues. Questions answered. Outlined signs and symptoms indicating need for more acute intervention. Patient verbalized understanding. After Visit Summary given.   SUBJECTIVE:  Yolanda Hatfield is a 37 y.o. female who presents with complaint of swelling and irritation of L 2nd distal finger. Gradual onset several weeks ago. No new exposures/contacts. Skin very dry. Sometimes painful and itching. Seen 06/23/2017 and note reviewed. Afebrile. No h/o similar. No specific aggravating or alleviating factors reported. No extremity sensation changes or weakness.   ROS: As per HPI.  OBJECTIVE: Vitals:   07/02/17 1719 07/02/17 1721  BP: (!) 96/58   Pulse: 83   Resp: 16   Temp: 98.9 F (37.2 C)   TempSrc: Oral   SpO2: 99% 99%    General appearance: alert; no distress Lungs: clear to auscultation bilaterally Heart: regular rate and rhythm Extremities: no edema Skin: warm and dry; L 2nd distal finger with very dry/eczematous skin changes around nail; no blisters or weeping; cool to touch; some tenderness; nail appears normal Psychological: alert and cooperative; normal mood and affect  Allergies  Allergen  Reactions  . Cephalexin Hives  . Codeine Phosphate Hives  . Sulfa Antibiotics Hives  . Sulfonamide Derivatives Hives    Past Medical History:  Diagnosis Date  . HIV infection [facility])    Social History   Socioeconomic History  . Marital status: Single    Spouse name: Not on file  . Number of children: 4  . Years of education: Not on file  . Highest education level: Bachelor's degree (e.g., BA, AB, BS)  Social Needs  . Financial resource strain: Not on file  . Food insecurity - worry: Not on file  . Food insecurity - inability: Not on file  . Transportation needs - medical: Not on file  . Transportation needs - non-medical: Not on file  Occupational History  . Occupation: CSR  Tobacco Use  . Smoking status: Former Smoker    Years: 2.00    Types: Cigarettes    Last attempt to quit: 10/29/2016    Years since quitting: 0.6  . Smokeless tobacco: Never Used  . Tobacco comment: 1 pack last 2 weeks  Substance and Sexual Activity  . Alcohol use: No  . Drug use: No  . Sexual activity: Yes    Partners: Male    Birth control/protection: None    Comment: pt. declined condoms  Other Topics Concern  . Not on file  Social History Narrative   Daughter Colin Mulders - 1998   Son Casimiro Needle - 2001  Daughter Journey - 2011      Patient is a single right-handed mother of 4. She lives in a 2 story house. She walks 20 minutes a day. She drinks 1 cup of coffee and 2 glasses of tea a day.   Family History  Problem Relation Age of Onset  . Diabetes Mother   . Stroke Mother   . High blood pressure Father   . High Cholesterol Father   . Diabetes Sister   . Diabetes Maternal Grandmother   . High blood pressure Paternal Grandmother   . High blood pressure Paternal Grandfather    Past Surgical History:  Procedure Laterality Date  . CESAREAN SECTION     X4  . TUBAL LIGATION       Mardella LaymanHagler, Pepe Mineau, MD 07/07/17 [id number]

## 2019-04-24 ENCOUNTER — Encounter (HOSPITAL_COMMUNITY): Payer: Self-pay | Admitting: Emergency Medicine

## 2019-04-24 ENCOUNTER — Other Ambulatory Visit: Payer: Self-pay

## 2019-04-24 ENCOUNTER — Ambulatory Visit (HOSPITAL_COMMUNITY)
Admission: EM | Admit: 2019-04-24 | Discharge: 2019-04-24 | Disposition: A | Discharge status: Home / Self Care | Payer: BLUE CROSS/BLUE SHIELD | Attending: Nurse Practitioner | Admitting: Nurse Practitioner

## 2019-04-24 DIAGNOSIS — L03011 Cellulitis of right finger: Secondary | ICD-10-CM | POA: Diagnosis present

## 2019-04-24 MED ORDER — IBUPROFEN 800 MG PO TABS: 800.0000 mg | ORAL_TABLET | Freq: Three times a day (TID) | ORAL | 0 refills | Status: AC

## 2019-04-24 MED ORDER — DOXYCYCLINE HYCLATE 100 MG PO CAPS: 100.0000 mg | ORAL_CAPSULE | Freq: Two times a day (BID) | ORAL | 0 refills | Status: DC

## 2019-04-24 MED ORDER — MUPIROCIN CALCIUM 2 % EX CREA: 1.0000 "application " | TOPICAL_CREAM | Freq: Two times a day (BID) | CUTANEOUS | 0 refills | Status: AC

## 2019-04-24 NOTE — ED Triage Notes (Signed)
Left index finger pain.  Fingertip is swollen, draining, painful.  History of the same

## 2019-04-24 NOTE — Discharge Instructions (Signed)
Keep the affected area clean. Soak the finger in warm water 2 times a day. Keep the area dry when you are not soaking it. Do not try to drain a fluid-filled bump on your own. Place prescription ointment at site twice a day after soaks  Take antibiotics as prescribed

## 2019-04-24 NOTE — ED Provider Notes (Signed)
[facility]    CSN: [id number] Arrival date & time: 04/24/19  1933      History   Chief Complaint Chief Complaint  Patient presents with  . Hand Pain    HPI Yolanda Hatfield is a 38 y.o. female.   Subjective:   Yolanda Hatfield is a 38 y.o. female who presents for evaluation of a probable cutaneous abscess. Lesion is located in the right index finger. Onset was 1 week ago. Symptoms have gradually worsened. Symptoms started after she took tweezers and cut a hangnail. Abscess has associated symptoms of spontaneous drainage, pain and swelling. Patient does not have previous history of cutaneous abscesses. Patient does not have diabetes.  The following portions of the patient's history were reviewed and updated as appropriate: allergies, current medications, past family history, past medical history, past social history, past surgical history and problem list.        Past Medical History:  Diagnosis Date  . HIV infection [facility](HCC)     Patient Active Problem List   Diagnosis Date Noted  . Bipolar affective disorder (HCC) 10/14/2011  . Hand pain 10/04/2011  . Dyspnea 10/04/2011  . Contraceptive management 07/08/2010  . ALLERGIC RHINITIS 10/18/2007  . HIV DISEASE 12/27/2006  . 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 CESAREAN SECTION     X4  . TUBAL LIGATION      OB History   No obstetric history on file.      Home Medications    Prior to Admission medications   Medication Sig Start Date End Date Taking? Authorizing Provider  Atazanavir Sulfate (REYATAZ PO) Take by mouth.   Yes [provider]  Aspirin-Acetaminophen-Caffeine (GOODY HEADACHE PO) Take 1 packet by mouth every 8 (eight) hours as needed.    [provider]  atazanavir (REYATAZ) 300 MG capsule Take 1 capsule (300 mg total) by mouth daily with breakfast. 07/14/11 07/13/12  Judyann MunsonSnider, Cynthia, MD  doxycycline (VIBRAMYCIN) 100 MG capsule Take  1 capsule (100 mg total) by mouth 2 (two) times daily. 04/24/19   Lurline IdolMurrill, Moranda Billiot, FNP  Emollient (QC SWEET OIL EX) Apply 5 drops topically daily as needed (ear pain).    [provider]  emtricitabine-tenofovir (TRUVADA) 200-300 MG per tablet Take 1 tablet by mouth daily. 07/14/11 07/13/12  Judyann MunsonSnider, Cynthia, MD  ibuprofen (ADVIL) 800 MG tablet Take 1 tablet (800 mg total) by mouth 3 (three) times daily. 04/24/19   Lurline IdolMurrill, Drayven Marchena, FNP  mupirocin cream (BACTROBAN) 2 % Apply 1 application topically 2 (two) times daily for 10 days. Apply small amount to affected areas twice daily after soaking finger 04/24/19 05/04/19  Lurline IdolMurrill, Adelma Bowdoin, FNP    Family History Family History  Problem Relation Age of Onset  . Diabetes Mother   . Stroke Mother   . High blood pressure Father   . High Cholesterol Father   . Diabetes Sister   . Diabetes Maternal Grandmother   . High blood pressure Paternal Grandmother   . High blood pressure Paternal Grandfather     Social History Social History   Tobacco Use  . Smoking status: Former Smoker    Years: 2.00    Types: Cigarettes    Quit date: 10/29/2016    Years since quitting: 2.4  . Smokeless tobacco: Never Used  . Tobacco comment: 1 pack last 2 weeks  Substance Use Topics  . Alcohol use: No  . Drug use: No     Allergies  Cephalexin, Codeine phosphate, Sulfa antibiotics, and Sulfonamide derivatives   Review of Systems Review of Systems  Constitutional: Negative for fatigue and fever.  Gastrointestinal: Negative for nausea and vomiting.  Skin: Positive for wound.  Neurological: Positive for headaches.     Physical Exam Triage Vital Signs ED Triage Vitals  Enc Vitals Group     BP 04/24/19 1949 114/68     Pulse Rate 04/24/19 1949 83     Resp 04/24/19 1949 16     Temp 04/24/19 1949 98.3 F (36.8 C)     Temp Source 04/24/19 1949 Oral     SpO2 04/24/19 1949 100 %     Weight --      Height --      Head Circumference --       Peak Flow --      Pain Score 04/24/19 1943 8     Pain Loc --      Pain Edu? --      Excl. in GC? --    No data found.  Updated Vital Signs BP 114/68 (BP Location: Right Arm)   Pulse 83   Temp 98.3 F (36.8 C) (Oral)   Resp 16   LMP 04/24/2019   SpO2 100%   Visual Acuity Right Eye Distance:   Left Eye Distance:   Bilateral Distance:    Right Eye Near:   Left Eye Near:    Bilateral Near:     Physical Exam Vitals signs reviewed.  Constitutional:      Appearance: Normal appearance.  HENT:     Head: Normocephalic.  Neck:     Musculoskeletal: Normal range of motion and neck supple.  Cardiovascular:     Rate and Rhythm: Normal rate and regular rhythm.  Pulmonary:     Effort: Pulmonary effort is normal.     Breath sounds: Normal breath sounds.  Musculoskeletal: Normal range of motion.       Hands:  Skin:    General: Skin is warm and dry.  Neurological:     General: No focal deficit present.     Mental Status: She is alert and oriented to person, place, and time.      UC Treatments / Results  Labs (all labs ordered are listed, but only abnormal results are displayed) Labs Reviewed  AEROBIC CULTURE (SUPERFICIAL SPECIMEN)    EKG   Radiology No results found.  Procedures Incision and Drainage  Date/Time: 04/24/2019 8:39 PM Performed by: Lurline Idol, FNP Authorized by: Lurline Idol, FNP   Consent:    Consent obtained:  Verbal   Consent given by:  Patient   Risks discussed:  Bleeding, pain and infection   Alternatives discussed:  No treatment Location:    Indications for incision and drainage: paronychia    Location:  Upper extremity   Upper extremity location:  Finger   Finger location:  R index finger Pre-procedure details:    Skin preparation:  Betadine Anesthesia (see MAR for exact dosages):    Anesthesia method:  None Procedure type:    Complexity:  Simple Procedure details:    Needle aspiration: no     Incision types:   Single straight   Incision depth:  Subungual   Scalpel blade:  15   Drainage:  Bloody   Drainage amount:  Scant   Wound treatment:  Wound left open   Packing materials:  None Post-procedure details:    Patient tolerance of procedure:  Tolerated well, no immediate complications   (including critical  care time)  Medications Ordered in UC Medications - No data to display  Initial Impression / Assessment and Plan / UC Course  I have reviewed the triage vital signs and the nursing notes.  Pertinent labs & imaging results that were available during my care of the patient were reviewed by me and considered in my medical decision making (see chart for details).    38 year old female presenting with a paronychia to the right index finger.  Patient afebrile.  Nontoxic-appearing.  Finger swollen with spontaneous clear drainage which has been sent off for cultures.  I & D procedure as above.  Warm soaks twice daily.  Mupirocin ointment twice a day.  Doxycycline twice a day.  Wound care discussed as well as indications for immediate follow-up.  Today's evaluation has revealed no signs of a dangerous process. Discussed diagnosis with patient and/or guardian. Patient and/or guardian aware of their diagnosis, possible red flag symptoms to watch out for and need for close follow up. Patient and/or guardian understands verbal and written discharge instructions. Patient and/or guardian comfortable with plan and disposition.  Patient and/or guardian has a clear mental status at this time, good insight into illness (after discussion and teaching) and has clear judgment to make decisions regarding their care  This care was provided during an unprecedented National Emergency due to the Novel Coronavirus (COVID-19) pandemic. COVID-19 infections and transmission risks place heavy strains on healthcare resources.  As this pandemic evolves, our facility, providers, and staff strive to respond fluidly, to remain  operational, and to provide care relative to available resources and information. Outcomes are unpredictable and treatments are without well-defined guidelines. Further, the impact of COVID-19 on all aspects of urgent care, including the impact to patients seeking care for reasons other than COVID-19, is unavoidable during this national emergency. At this time of the global pandemic, management of patients has significantly changed, even for non-COVID positive patients given high local and regional COVID volumes at this time requiring high healthcare system and resource utilization. The standard of care for management of both COVID suspected and non-COVID suspected patients continues to change rapidly at the local, regional, national, and global levels. This patient was worked up and treated to the best available but ever changing evidence and resources available at this current time.   Documentation was completed with the aid of voice recognition software. Transcription may contain typographical errors. Final Clinical Impressions(s) / UC Diagnoses   Final diagnoses:  Paronychia of right index finger     Discharge Instructions      Keep the affected area clean.  Soak the finger in warm water 2 times a day.  Keep the area dry when you are not soaking it.  Do not try to drain a fluid-filled bump on your own.  Place prescription ointment at site twice a day after soaks   Take antibiotics as prescribed     ED Prescriptions    Medication Sig Dispense Auth. Provider   doxycycline (VIBRAMYCIN) 100 MG capsule Take 1 capsule (100 mg total) by mouth 2 (two) times daily. 20 capsule Lurline Idol, FNP   mupirocin cream (BACTROBAN) 2 % Apply 1 application topically 2 (two) times daily for 10 days. Apply small amount to affected areas twice daily after soaking finger 20 g Lurline Idol, FNP   ibuprofen (ADVIL) 800 MG tablet Take 1 tablet (800 mg total) by mouth 3 (three) times daily. 21  tablet Lurline Idol, FNP     PDMP not reviewed this encounter.  Enrique Sack, [place] 04/24/19 2042

## 2019-04-27 LAB — AEROBIC CULTURE? (SUPERFICIAL SPECIMEN): Gram Stain: NONE SEEN

## 2019-04-27 LAB — AEROBIC CULTURE W GRAM STAIN (SUPERFICIAL SPECIMEN)

## 2019-04-30 ENCOUNTER — Telehealth (HOSPITAL_COMMUNITY): Payer: Self-pay | Admitting: Emergency Medicine

## 2019-04-30 NOTE — Telephone Encounter (Signed)
Wound shows staph aureus, treated with doxycycline. Attempted to reach patient to see how she was doing, no answer.

## 2021-06-02 ENCOUNTER — Other Ambulatory Visit: Payer: Self-pay

## 2021-06-02 ENCOUNTER — Encounter: Payer: Self-pay | Admitting: Emergency Medicine

## 2021-06-02 ENCOUNTER — Ambulatory Visit
Admission: EM | Admit: 2021-06-02 | Discharge: 2021-06-02 | Disposition: A | Discharge status: Home / Self Care | Payer: Self-pay | Attending: Urgent Care | Admitting: Urgent Care

## 2021-06-02 DIAGNOSIS — B349 Viral infection, unspecified: Secondary | ICD-10-CM | POA: Insufficient documentation

## 2021-06-02 DIAGNOSIS — J069 Acute upper respiratory infection, unspecified: Secondary | ICD-10-CM

## 2021-06-02 DIAGNOSIS — Z9189 Other specified personal risk factors, not elsewhere classified: Secondary | ICD-10-CM | POA: Insufficient documentation

## 2021-06-02 DIAGNOSIS — R070 Pain in throat: Secondary | ICD-10-CM | POA: Insufficient documentation

## 2021-06-02 LAB — POCT RAPID STREP A (OFFICE): Rapid Strep A Screen: NEGATIVE

## 2021-06-02 MED ORDER — OSELTAMIVIR PHOSPHATE 75 MG PO CAPS: 75.0000 mg | ORAL_CAPSULE | Freq: Two times a day (BID) | ORAL | 0 refills | Status: AC

## 2021-06-02 MED ORDER — ACETAMINOPHEN 325 MG PO TABS
650.0000 mg | ORAL_TABLET | Freq: Once | ORAL | Status: AC
  Administered 2021-06-02: 650 mg via ORAL

## 2021-06-02 NOTE — Discharge Instructions (Signed)
We will notify you of your test results as they arrive and may take between 48-72 hours.  I encourage you to sign up for MyChart if you have not already done so as this can be the easiest way for [place] to communicate results to you online or through a phone app.  Generally, we only contact you if it is a positive test result.  In the meantime, if you develop worsening symptoms including fever, chest pain, shortness of breath despite our current treatment plan then please report to the emergency room as this may be a sign of worsening status from possible viral infection.  Otherwise, we will manage this as a viral syndrome like influenza with Tamiflu. For sore throat or cough try using a honey-based tea. Use 3 teaspoons of honey with juice squeezed from half lemon. Place shaved pieces of ginger into 1/2-1 cup of water and warm over stove top. Then mix the ingredients and repeat every 4 hours as needed. Please take Tylenol 500mg -650mg  every 6 hours for aches and pains, fevers. Hydrate very well with at least 2 liters of water. Eat light meals such as soups to replenish electrolytes and soft fruits, veggies. Start an antihistamine like Zyrtec for postnasal drainage, sinus congestion.  You can take this together with pseudoephedrine (Sudafed) at a dose of 60 mg 2-3 times a day as needed for the same kind of congestion.

## 2021-06-02 NOTE — ED Provider Notes (Signed)
[facility]   MRN: [id number] DOB: 01-Sep-1980  Subjective:   Yolanda Hatfield is a 41 y.o. female with pmh of HIV disease presenting for 3 day history of acute onset body aches, throat pain, painful swallowing, headaches. No cough, chest pain, shob. Had 1 sick contact with a [place] baby that goes to daycare. No testing was done for them. No smoking history.    Current Facility-Administered Medications:    acetaminophen (TYLENOL) tablet 650 mg, 650 mg, Oral, Once, Wallis Bamberg, PA-C  Current Outpatient Medications:    Aspirin-Acetaminophen-Caffeine (GOODY HEADACHE PO), Take 1 packet by mouth every 8 (eight) hours as needed., Disp: , Rfl:    atazanavir (REYATAZ) 300 MG capsule, Take 1 capsule (300 mg total) by mouth daily with breakfast., Disp: 30 capsule, Rfl: 2   Atazanavir Sulfate (REYATAZ PO), Take by mouth., Disp: , Rfl:    doxycycline (VIBRAMYCIN) 100 MG capsule, Take 1 capsule (100 mg total) by mouth 2 (two) times daily., Disp: 20 capsule, Rfl: 0   Emollient (QC SWEET OIL EX), Apply 5 drops topically daily as needed (ear pain)., Disp: , Rfl:    emtricitabine-tenofovir (TRUVADA) 200-300 MG per tablet, Take 1 tablet by mouth daily., Disp: 30 tablet, Rfl: 2   ibuprofen (ADVIL) 800 MG tablet, Take 1 tablet (800 mg total) by mouth 3 (three) times daily., Disp: 21 tablet, Rfl: 0   Allergies  Allergen Reactions   Cephalexin Hives   Codeine Phosphate Hives   Sulfa Antibiotics Hives   Sulfonamide Derivatives Hives    Past Medical History:  Diagnosis Date   HIV infection (HCC)      Past Surgical History:  Procedure Laterality Date   CESAREAN SECTION     X4   TUBAL LIGATION      Family History  Problem Relation Age of Onset   Diabetes Mother    Stroke Mother    High blood pressure Father    High Cholesterol Father    Diabetes Sister    Diabetes Maternal Grandmother    High blood pressure Paternal Grandmother    High blood pressure Paternal Grandfather      Social History   Tobacco Use   Smoking status: Former    Years: 2.00    Types: Cigarettes    Quit date: 10/29/2016    Years since quitting: 4.5   Smokeless tobacco: Never   Tobacco comments:    1 pack last 2 weeks  Vaping Use   Vaping Use: Never used  Substance Use Topics   Alcohol use: No   Drug use: No    ROS   Objective:   Vitals: BP (!) 146/88 (BP Location: Right Arm)    Pulse (!) 109    Temp (!) 100.5 F (38.1 C) (Oral)    Resp 18    LMP 05/31/2021 (Exact Date)    SpO2 97%   Physical Exam Constitutional:      General: She is not in acute distress.    Appearance: Normal appearance. She is well-developed. She is not ill-appearing, toxic-appearing or diaphoretic.  HENT:     Head: Normocephalic and atraumatic.     Right Ear: External ear normal.     Left Ear: External ear normal.     Nose: Nose normal.     Mouth/Throat:     Mouth: Mucous membranes are moist.     Pharynx: No oropharyngeal exudate or posterior oropharyngeal erythema.  Eyes:     Extraocular Movements: Extraocular movements intact.  Conjunctiva/sclera: Conjunctivae normal.  Cardiovascular:     Rate and Rhythm: Normal rate and regular rhythm.     Pulses: Normal pulses.     Heart sounds: Normal heart sounds. No murmur heard.   No friction rub. No gallop.  Pulmonary:     Effort: Pulmonary effort is normal. No respiratory distress.     Breath sounds: Normal breath sounds. No stridor. No wheezing, rhonchi or rales.  Skin:    General: Skin is warm and dry.     Findings: No rash.  Neurological:     Mental Status: She is alert and oriented to person, place, and time.  Psychiatric:        Mood and Affect: Mood normal.        Behavior: Behavior normal.        Thought Content: Thought content normal.   Results for orders placed or performed during the hospital encounter of 06/02/21 (from the past 24 hour(s))  POCT rapid strep A     Status: None   Collection Time: 06/02/21  8:28 AM  Result Value  Ref Range   Rapid Strep A Screen Negative Negative    Assessment and Plan :   PDMP not reviewed this encounter.  1. Acute viral syndrome   2. Throat pain   3. At increased risk of exposure to COVID-19 virus    Tylenol given in clinic. Strep culture, COVID, flu testing pending. Will cover for influenza with Tamiflu given fever, symptom set, current incidence in the community.  Use supportive care, rest, fluids, hydration, light meals, schedule Tylenol. Counseled patient on potential for adverse effects with medications prescribed today, patient verbalized understanding. ER and return-to-clinic precautions discussed, patient verbalized understanding.    Wallis Bamberg, [place] 06/02/21 [phone]

## 2021-06-02 NOTE — ED Triage Notes (Signed)
Sore throat, headache, body aches x 3 days.

## 2021-06-03 ENCOUNTER — Ambulatory Visit: Payer: Self-pay

## 2021-06-03 LAB — COVID-19, FLU A+B NAA
Influenza A, NAA: NOT DETECTED
Influenza B, NAA: NOT DETECTED
SARS-CoV-2, NAA: NOT DETECTED

## 2021-06-04 ENCOUNTER — Ambulatory Visit
Admission: EM | Admit: 2021-06-04 | Discharge: 2021-06-04 | Disposition: A | Discharge status: Home / Self Care | Payer: Self-pay | Attending: Family Medicine | Admitting: Family Medicine

## 2021-06-04 ENCOUNTER — Other Ambulatory Visit: Payer: Self-pay

## 2021-06-04 ENCOUNTER — Encounter: Payer: Self-pay | Admitting: Emergency Medicine

## 2021-06-04 ENCOUNTER — Telehealth (HOSPITAL_COMMUNITY): Payer: Self-pay

## 2021-06-04 DIAGNOSIS — J039 Acute tonsillitis, unspecified: Secondary | ICD-10-CM

## 2021-06-04 DIAGNOSIS — J0100 Acute maxillary sinusitis, unspecified: Secondary | ICD-10-CM

## 2021-06-04 LAB — CULTURE, GROUP A STREP (THRC)

## 2021-06-04 MED ORDER — LIDOCAINE VISCOUS HCL 2 % MT SOLN: 10.0000 mL | OROMUCOSAL | 0 refills | Status: AC | PRN

## 2021-06-04 MED ORDER — CLINDAMYCIN HCL 300 MG PO CAPS: 300.0000 mg | ORAL_CAPSULE | Freq: Two times a day (BID) | ORAL | 0 refills | Status: AC

## 2021-06-04 MED ORDER — DOXYCYCLINE HYCLATE 100 MG PO CAPS: 100.0000 mg | ORAL_CAPSULE | Freq: Two times a day (BID) | ORAL | 0 refills | Status: AC

## 2021-06-04 NOTE — ED Triage Notes (Signed)
Pt seen on 1/3 her ST is getting worse hurts to swallow.

## 2021-06-04 NOTE — ED Provider Notes (Signed)
[facility]    CSN: [id number] Arrival date & time: 06/04/21  0855      History   Chief Complaint Chief Complaint  Patient presents with   Sore Throat    HPI Yolanda Hatfield is a 41 y.o. female.   Patient presenting today with progressively worsening nasal congestion, sinus pain and pressure, sore, swollen throat, weakness the past week.  She was seen here on 06/02/2021, tested negative for strep, flu, COVID and given symptomatic management and Tamiflu in case influenza.  She states the medications have not helped and her symptoms have continued to progress to where she is unable to eat or drink for the past day or so due to the pain.  She has been taking ibuprofen with minimal temporary relief of her symptoms.  No known new sick contacts recently.  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     Home Medications    Prior to Admission medications   Medication Sig Start Date End Date Taking? Authorizing Provider  lidocaine (XYLOCAINE) 2 % solution Use as directed 10 mLs in the mouth or throat every 3 (three) hours as needed for mouth pain. 06/04/21  Yes Particia Nearing, PA-C  Aspirin-Acetaminophen-Caffeine (GOODY HEADACHE PO) Take 1 packet by mouth every 8 (eight) hours as needed.    [provider]  atazanavir (REYATAZ) 300 MG capsule Take 1 capsule (300 mg total) by mouth daily with breakfast. 07/14/11 07/13/12  Judyann Munson, MD  Atazanavir Sulfate (REYATAZ PO) Take by mouth.     [provider]  doxycycline (VIBRAMYCIN) 100 MG capsule Take 1 capsule (100 mg total) by mouth 2 (two) times daily. 06/04/21   Particia Nearing, PA-C  Emollient (QC SWEET OIL EX) Apply 5 drops topically daily as needed (ear pain).    [provider]  emtricitabine-tenofovir (TRUVADA) 200-300 MG per tablet Take 1 tablet by mouth daily. 07/14/11 07/13/12  Judyann Munson, MD  ibuprofen (ADVIL) 800 MG tablet Take 1 tablet (800 mg total) by mouth 3 (three) times daily. 04/24/19   Lurline Idol, FNP  oseltamivir (TAMIFLU) 75 MG capsule Take 1 capsule (75 mg total) by mouth 2 (two) times daily. 06/02/21   Wallis Bamberg, PA-C    Family History Family History  Problem Relation Age of Onset   Diabetes Mother    Stroke Mother    High blood pressure Father    High Cholesterol Father    Diabetes Sister    Diabetes Maternal Grandmother    High blood pressure Paternal Grandmother    High blood pressure Paternal Grandfather     Social History Social History   Tobacco Use   Smoking status: Former    Years: 2.00    Types: Cigarettes    Quit date: 10/29/2016    Years since quitting: 4.6   Smokeless tobacco: Never   Tobacco comments:    1 pack last 2 weeks  Vaping Use  Vaping Use: Never used  Substance Use Topics   Alcohol use: No   Drug use: No     Allergies   Cephalexin, Codeine phosphate, Sulfa antibiotics, and Sulfonamide derivatives   Review of Systems Review of Systems Per HPI  Physical Exam Triage Vital Signs ED Triage Vitals  Enc Vitals Group     BP 06/04/21 1039 (!) 125/92     Pulse Rate 06/04/21 1039 (!) 129     Resp 06/04/21 1039 18     Temp 06/04/21 1039 98.8 F (37.1 C)     Temp Source 06/04/21 1039 Oral     SpO2 06/04/21 1039 98 %     Weight --      Height --      Head Circumference --      Peak Flow --      Pain Score 06/04/21 1038 10     Pain Loc --      Pain Edu? --      Excl. in GC? --    No data found.  Updated Vital Signs BP  (!) 125/92 (BP Location: Right Arm)    Pulse (!) 129    Temp 98.8 F (37.1 C) (Oral)    Resp 18    LMP 05/31/2021 (Exact Date)    SpO2 98%   Visual Acuity Right Eye Distance:   Left Eye Distance:   Bilateral Distance:    Right Eye Near:   Left Eye Near:    Bilateral Near:     Physical Exam Vitals and nursing note reviewed.  Constitutional:      Appearance: She is not ill-appearing.     Comments: Appears in significant pain  HENT:     Head: Atraumatic.     Right Ear: Tympanic membrane and external ear normal.     Left Ear: Tympanic membrane and external ear normal.     Nose: Congestion present.     Comments: Bilateral maxillary sinuses tender to palpation    Mouth/Throat:     Mouth: Mucous membranes are moist.     Pharynx: Oropharyngeal exudate and posterior oropharyngeal erythema present.     Comments: Bilateral tonsillar exudates, ulcerations, diffuse erythema.  Uvula midline, oral airway patent Eyes:     Extraocular Movements: Extraocular movements intact.     Conjunctiva/sclera: Conjunctivae normal.  Cardiovascular:     Rate and Rhythm: Normal rate and regular rhythm.     Heart sounds: Normal heart sounds.  Pulmonary:     Effort: Pulmonary effort is normal.     Breath sounds: Normal breath sounds. No wheezing or rales.  Musculoskeletal:        General: Normal range of motion.     Cervical back: Normal range of motion and neck supple.  Skin:    General: Skin is warm and dry.  Neurological:     Mental Status: She is alert and oriented to person, place, and time.     Motor: No weakness.     Gait: Gait normal.  Psychiatric:        Mood and Affect: Mood normal.        Thought Content: Thought content normal.        Judgment: Judgment normal.     UC Treatments / Results  Labs (all labs ordered are listed, but only abnormal results are displayed) Labs Reviewed - No data to display  EKG   Radiology No results found.  Procedures Procedures (including critical  care time)  Medications Ordered in UC  Medications - No data to display  Initial Impression / Assessment and Plan / UC Course  I have reviewed the triage vital signs and the nursing notes.  Pertinent labs & imaging results that were available during my care of the patient were reviewed by me and considered in my medical decision making (see chart for details).     Tested 2 days ago for strep, flu, COVID and all were negative.  Continues to be tachycardic but otherwise vital signs reassuring today.  Given her progressively worsening symptoms, will cover for a bacterial tonsillitis and sinusitis with doxycycline for 10 days.  Viscous lidocaine, over-the-counter pain relievers, salt gargles and other supportive care.  Return for acutely worsening symptoms.  Final Clinical Impressions(s) / UC Diagnoses   Final diagnoses:  Acute tonsillitis, unspecified etiology  Acute maxillary sinusitis, recurrence not specified   Discharge Instructions   None    ED Prescriptions     Medication Sig Dispense Auth. Provider   doxycycline (VIBRAMYCIN) 100 MG capsule Take 1 capsule (100 mg total) by mouth 2 (two) times daily. 20 capsule Particia Nearing, PA-C   lidocaine (XYLOCAINE) 2 % solution Use as directed 10 mLs in the mouth or throat every 3 (three) hours as needed for mouth pain. 100 mL Particia Nearing, [place]      PDMP not reviewed this encounter.   Particia Nearing, [place] 06/04/21 1127

## 2021-06-17 ENCOUNTER — Ambulatory Visit
Admission: EM | Admit: 2021-06-17 | Discharge: 2021-06-17 | Disposition: A | Discharge status: Home / Self Care | Payer: Self-pay | Attending: Family Medicine | Admitting: Family Medicine

## 2021-06-17 DIAGNOSIS — B029 Zoster without complications: Secondary | ICD-10-CM

## 2021-06-17 DIAGNOSIS — L0231 Cutaneous abscess of buttock: Secondary | ICD-10-CM

## 2021-06-17 MED ORDER — CLINDAMYCIN HCL 300 MG PO CAPS: 300.0000 mg | ORAL_CAPSULE | Freq: Two times a day (BID) | ORAL | 0 refills | Status: AC

## 2021-06-17 MED ORDER — PREDNISONE 20 MG PO TABS: 40.0000 mg | ORAL_TABLET | Freq: Every day | ORAL | 0 refills | Status: AC

## 2021-06-17 MED ORDER — VALACYCLOVIR HCL 1 G PO TABS: 1000.0000 mg | ORAL_TABLET | Freq: Three times a day (TID) | ORAL | 0 refills | Status: AC

## 2021-06-17 NOTE — ED Triage Notes (Signed)
Patient states about 2 weeks ago she came in and was diagnosed with Strep  Patient thinks the medication she is taking is making her is giving her a rash on the nose and her right side   Patient states she was given Doxycycline the 1st time and she came back in and was given Cyclamycin but has not filled that prescription  Patient does not know if the rash is from where she is staying  Denies Fever

## 2021-06-17 NOTE — ED Provider Notes (Signed)
[facility]    CSN: [id number] Arrival date & time: 06/17/21  [id number]      History   Chief Complaint Chief Complaint  Patient presents with   Rash    HPI Yolanda Hatfield is a 41 y.o. female.   Presenting today with a rash across her right flank that appeared yesterday.  She denies itching, pain, new soaps or lotions, new detergents, new foods.  She is on doxycycline currently for strep infection but is on day 9 of 10 and had not had any issues prior to this day.  She is also having some thick drainage from her nasal piercing all of a sudden and some ulceration in the area.  She also has an area on her left gluteus that has been swollen, tender.  Has not been trying anything for these areas.  Denies fever, drainage.  Of note, patient is HIV positive on antivirals.   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      Home Medications    Prior to Admission medications   Medication Sig Start Date End Date Taking? Authorizing Provider  clindamycin (CLEOCIN) 300 MG capsule Take 1 capsule (300 mg total) by mouth 2 (two) times daily. 06/17/21  Yes Particia NearingLane, Ela Moffat Elizabeth, PA-C  predniSONE (DELTASONE) 20 MG tablet Take 2 tablets (40 mg total) by mouth daily with breakfast. 06/17/21  Yes Particia NearingLane, Kay Ricciuti Elizabeth, PA-C  valACYclovir (VALTREX) 1000 MG tablet Take 1 tablet (1,000 mg total) by mouth 3 (three) times daily for 7 days. 06/17/21 06/24/21 Yes Particia NearingLane, Jaquay Posthumus Elizabeth, PA-C  Aspirin-Acetaminophen-Caffeine (GOODY HEADACHE PO) Take 1 packet by mouth  every 8 (eight) hours as needed.    [provider]  atazanavir (REYATAZ) 300 MG capsule Take 1 capsule (300 mg total) by mouth daily with breakfast. 07/14/11 07/13/12  Judyann MunsonSnider, Cynthia, MD  Atazanavir Sulfate (REYATAZ PO) Take by mouth.    [provider]  doxycycline (VIBRAMYCIN) 100 MG capsule Take 1 capsule (100 mg total) by mouth 2 (two) times daily. 06/04/21   Particia NearingLane, Leveta Wahab Elizabeth, PA-C  Emollient (QC SWEET OIL EX) Apply 5 drops topically daily as needed (ear pain).    [provider]  emtricitabine-tenofovir (TRUVADA) 200-300 MG per tablet Take 1 tablet by mouth daily. 07/14/11 07/13/12  Judyann MunsonSnider, Cynthia, MD  ibuprofen (ADVIL) 800 MG tablet Take 1 tablet (800 mg total) by mouth 3 (three) times daily. 04/24/19   Lurline IdolMurrill, Samantha, FNP  lidocaine (XYLOCAINE) 2 % solution Use as directed 10 mLs in the mouth or throat every 3 (three) hours as needed for mouth pain. 06/04/21   Particia NearingLane, Ceclia Koker Elizabeth, PA-C  oseltamivir (TAMIFLU) 75 MG capsule Take 1 capsule (75 mg total) by mouth 2 (two) times daily. 06/02/21   Wallis BambergMani, Mario, PA-C    Family History Family History  Problem Relation Age of Onset   Diabetes Mother    Stroke Mother    High blood pressure Father    High Cholesterol Father  Diabetes Sister    Diabetes Maternal Grandmother    High blood pressure Paternal Grandmother    High blood pressure Paternal Grandfather     Social History Social History   Tobacco Use   Smoking status: Former    Years: 2.00    Types: Cigarettes    Quit date: 10/29/2016    Years since quitting: 4.6   Smokeless tobacco: Never   Tobacco comments:    1 pack last 2 weeks  Vaping Use   Vaping Use: Never used  Substance Use Topics   Alcohol use: No   Drug use: No     Allergies   Cephalexin, Codeine phosphate, Sulfa antibiotics, and Sulfonamide derivatives   Review of Systems Review of Systems Per HPI  Physical Exam Triage Vital Signs ED Triage Vitals  Enc Vitals Group      BP 06/17/21 0833 106/73     Pulse Rate 06/17/21 0833 91     Resp 06/17/21 0833 16     Temp 06/17/21 0833 98.1 F (36.7 C)     Temp Source 06/17/21 0833 Oral     SpO2 06/17/21 0833 99 %     Weight --      Height --      Head Circumference --      Peak Flow --      Pain Score 06/17/21 0831 0     Pain Loc --      Pain Edu? --      Excl. in GC? --    No data found.  Updated Vital Signs BP 106/73 (BP Location: Right Arm)    Pulse 91    Temp 98.1 F (36.7 C) (Oral)    Resp 16    LMP 05/31/2021 (Exact Date)    SpO2 99%   Visual Acuity Right Eye Distance:   Left Eye Distance:   Bilateral Distance:    Right Eye Near:   Left Eye Near:    Bilateral Near:     Physical Exam Vitals and nursing note reviewed.  Constitutional:      Appearance: Normal appearance. She is not ill-appearing.  HENT:     Head: Atraumatic.  Eyes:     Extraocular Movements: Extraocular movements intact.     Conjunctiva/sclera: Conjunctivae normal.  Cardiovascular:     Rate and Rhythm: Normal rate and regular rhythm.     Heart sounds: Normal heart sounds.  Pulmonary:     Effort: Pulmonary effort is normal.     Breath sounds: Normal breath sounds.  Musculoskeletal:        General: Normal range of motion.     Cervical back: Normal range of motion and neck supple.  Skin:    General: Skin is warm.     Comments: Erythematous vesicular rash extending in a linear pattern from right lower abdomen across flank to right low back, stopping at midline.  Nontender to palpation, not actively draining.  Ulcerated draining area around her nose piercing left external nasal fold.  Firm, tender abscess forming left gluteal fold.  No active drainage, erythema, induration  Neurological:     Mental Status: She is alert and oriented to person, place, and time.  Psychiatric:        Mood and Affect: Mood normal.        Thought Content: Thought content normal.        Judgment: Judgment normal.     UC Treatments / Results   Labs (all labs ordered are listed, but only  abnormal results are displayed) Labs Reviewed - No data to display  EKG   Radiology No results found.  Procedures Procedures (including critical care time)  Medications Ordered in UC Medications - No data to display  Initial Impression / Assessment and Plan / UC Course  I have reviewed the triage vital signs and the nursing notes.  Pertinent labs & imaging results that were available during my care of the patient were reviewed by me and considered in my medical decision making (see chart for details).     Consistent with shingles on the right flank, treat with Valtrex, prednisone and discussed contagion with patient.  We will also start on clindamycin for 2 new areas that appear to be becoming a bacterial infection.  She is just finishing a course of doxycycline for strep tonsillitis as well.  Discussed strict return precautions for unresolving or worsening symptoms.  Final Clinical Impressions(s) / UC Diagnoses   Final diagnoses:  Abscess, gluteal, left  Herpes zoster without complication   Discharge Instructions   None    ED Prescriptions     Medication Sig Dispense Auth. Provider   valACYclovir (VALTREX) 1000 MG tablet Take 1 tablet (1,000 mg total) by mouth 3 (three) times daily for 7 days. 21 tablet Particia Nearing, [place]   predniSONE (DELTASONE) 20 MG tablet Take 2 tablets (40 mg total) by mouth daily with breakfast. 10 tablet Particia Nearing, PA-C   clindamycin (CLEOCIN) 300 MG capsule Take 1 capsule (300 mg total) by mouth 2 (two) times daily. 14 capsule Particia Nearing, [place]      PDMP not reviewed this encounter.   Particia Nearing, [place] 06/17/21 1153
